# Patient Record
Sex: Male | Born: 1950 | Hispanic: No | Marital: Married | Smoking: Former smoker
Health system: Southern US, Community
[De-identification: ages and names within clinical notes are randomized; demographics above are authoritative.]

## PROBLEM LIST (undated history)

## (undated) DIAGNOSIS — E781 Pure hyperglyceridemia: Secondary | ICD-10-CM

## (undated) DIAGNOSIS — R972 Elevated prostate specific antigen [PSA]: Secondary | ICD-10-CM

## (undated) DIAGNOSIS — M47814 Spondylosis without myelopathy or radiculopathy, thoracic region: Secondary | ICD-10-CM

## (undated) DIAGNOSIS — G44009 Cluster headache syndrome, unspecified, not intractable: Secondary | ICD-10-CM

## (undated) DIAGNOSIS — M7532 Calcific tendinitis of left shoulder: Secondary | ICD-10-CM

## (undated) DIAGNOSIS — F419 Anxiety disorder, unspecified: Secondary | ICD-10-CM

## (undated) DIAGNOSIS — M720 Palmar fascial fibromatosis [Dupuytren]: Secondary | ICD-10-CM

## (undated) DIAGNOSIS — K4090 Unilateral inguinal hernia, without obstruction or gangrene, not specified as recurrent: Secondary | ICD-10-CM

## (undated) DIAGNOSIS — K635 Polyp of colon: Secondary | ICD-10-CM

## (undated) DIAGNOSIS — R7303 Prediabetes: Secondary | ICD-10-CM

## (undated) DIAGNOSIS — I10 Essential (primary) hypertension: Secondary | ICD-10-CM

## (undated) DIAGNOSIS — M5414 Radiculopathy, thoracic region: Secondary | ICD-10-CM

## (undated) DIAGNOSIS — L720 Epidermal cyst: Secondary | ICD-10-CM

## (undated) HISTORY — DX: Epidermal cyst: L72.0

## (undated) HISTORY — DX: Calcific tendinitis of left shoulder: M75.32

## (undated) HISTORY — DX: Radiculopathy, thoracic region: M54.14

## (undated) HISTORY — DX: Essential (primary) hypertension: I10

## (undated) HISTORY — DX: Palmar fascial fibromatosis (dupuytren): M72.0

## (undated) HISTORY — DX: Unilateral inguinal hernia, without obstruction or gangrene, not specified as recurrent: K40.90

## (undated) HISTORY — PX: COLONOSCOPY W/ POLYPECTOMY: SHX1380

## (undated) HISTORY — DX: Anxiety disorder, unspecified: F41.9

## (undated) HISTORY — DX: Pure hyperglyceridemia: E78.1

## (undated) HISTORY — PX: CATARACT EXTRACTION, BILATERAL: SHX1313

## (undated) HISTORY — DX: Spondylosis without myelopathy or radiculopathy, thoracic region: M47.814

## (undated) HISTORY — DX: Prediabetes: R73.03

## (undated) HISTORY — DX: Polyp of colon: K63.5

## (undated) HISTORY — DX: Elevated prostate specific antigen (PSA): R97.20

## (undated) HISTORY — DX: Cluster headache syndrome, unspecified, not intractable: G44.009

## (undated) HISTORY — PX: HERNIA REPAIR: SHX51

## (undated) HISTORY — PX: OTHER SURGICAL HISTORY: SHX169

---

## 1988-08-27 DIAGNOSIS — G44009 Cluster headache syndrome, unspecified, not intractable: Secondary | ICD-10-CM

## 1988-08-27 HISTORY — DX: Cluster headache syndrome, unspecified, not intractable: G44.009

## 2000-10-02 ENCOUNTER — Encounter: Payer: Self-pay | Admitting: Family Medicine

## 2000-10-02 ENCOUNTER — Encounter: Admission: RE | Admit: 2000-10-02 | Discharge: 2000-10-02 | Payer: Self-pay | Admitting: Family Medicine

## 2005-11-14 ENCOUNTER — Encounter: Payer: Self-pay | Admitting: Internal Medicine

## 2010-04-03 ENCOUNTER — Encounter: Payer: Self-pay | Admitting: Internal Medicine

## 2010-04-03 ENCOUNTER — Ambulatory Visit: Payer: Self-pay | Admitting: Internal Medicine

## 2010-04-03 DIAGNOSIS — E119 Type 2 diabetes mellitus without complications: Secondary | ICD-10-CM

## 2010-04-03 DIAGNOSIS — Z86010 Personal history of colonic polyps: Secondary | ICD-10-CM

## 2010-04-03 DIAGNOSIS — M47814 Spondylosis without myelopathy or radiculopathy, thoracic region: Secondary | ICD-10-CM | POA: Insufficient documentation

## 2010-04-03 DIAGNOSIS — M129 Arthropathy, unspecified: Secondary | ICD-10-CM | POA: Insufficient documentation

## 2010-04-03 DIAGNOSIS — R51 Headache: Secondary | ICD-10-CM

## 2010-04-03 DIAGNOSIS — R519 Headache, unspecified: Secondary | ICD-10-CM | POA: Insufficient documentation

## 2010-04-03 DIAGNOSIS — R030 Elevated blood-pressure reading, without diagnosis of hypertension: Secondary | ICD-10-CM

## 2010-04-03 LAB — CONVERTED CEMR LAB
Anti Nuclear Antibody(ANA): NEGATIVE
Rheumatoid fact SerPl-aCnc: <20 intl units/mL (ref 0–20)

## 2010-04-04 ENCOUNTER — Encounter: Payer: Self-pay | Admitting: Internal Medicine

## 2010-04-28 ENCOUNTER — Encounter: Payer: Self-pay | Admitting: Internal Medicine

## 2010-09-24 LAB — CONVERTED CEMR LAB
ALT: 19 units/L (ref 0–53)
AST: 23 units/L (ref 0–37)
Albumin: 4.3 g/dL (ref 3.5–5.2)
Alkaline Phosphatase: 63 units/L (ref 39–117)
BUN: 16 mg/dL (ref 6–23)
Basophils Absolute: 0.1 10*3/uL (ref 0.0–0.1)
Basophils Relative: 0.8 % (ref 0.0–3.0)
Bilirubin, Direct: 0.2 mg/dL (ref 0.0–0.3)
CO2: 31 meq/L (ref 19–32)
CRP, High Sensitivity: 2.28 (ref 0.00–5.00)
Calcium: 9.4 mg/dL (ref 8.4–10.5)
Chloride: 103 meq/L (ref 96–112)
Cholesterol: 212 mg/dL — ABNORMAL HIGH (ref 0–200)
Creatinine, Ser: 0.8 mg/dL (ref 0.4–1.5)
Direct LDL: 86.9 mg/dL
Eosinophils Absolute: 0.1 10*3/uL (ref 0.0–0.7)
Eosinophils Relative: 0.7 % (ref 0.0–5.0)
GFR calc non Af Amer: 106.8 mL/min (ref >60)
Glucose, Bld: 80 mg/dL (ref 70–99)
HCT: 43.6 % (ref 39.0–52.0)
HDL: 48.5 mg/dL (ref >39.00)
Hemoglobin: 15.4 g/dL (ref 13.0–17.0)
Hgb A1c MFr Bld: 5.8 % (ref 4.6–6.5)
Lymphocytes Relative: 21.6 % (ref 12.0–46.0)
Lymphs Abs: 1.7 10*3/uL (ref 0.7–4.0)
MCHC: 35.5 g/dL (ref 30.0–36.0)
MCV: 89.3 fL (ref 78.0–100.0)
Monocytes Absolute: 0.7 10*3/uL (ref 0.1–1.0)
Monocytes Relative: 8.8 % (ref 3.0–12.0)
Neutro Abs: 5.4 10*3/uL (ref 1.4–7.7)
Neutrophils Relative %: 68.1 % (ref 43.0–77.0)
PSA: 2.53 ng/mL (ref 0.10–4.00)
Platelets: 316 10*3/uL (ref 150.0–400.0)
Potassium: 4.5 meq/L (ref 3.5–5.1)
RBC: 4.88 M/uL (ref 4.22–5.81)
RDW: 13.9 % (ref 11.5–14.6)
Sed Rate: 11 mm/hr (ref 0–22)
Sodium: 142 meq/L (ref 135–145)
TSH: 0.98 microintl units/mL (ref 0.35–5.50)
Total Bilirubin: 0.8 mg/dL (ref 0.3–1.2)
Total CHOL/HDL Ratio: 4
Total CK: 192 units/L (ref 7–232)
Total Protein: 7.5 g/dL (ref 6.0–8.3)
Triglycerides: 205 mg/dL — ABNORMAL HIGH (ref 0.0–149.0)
VLDL: 41 mg/dL — ABNORMAL HIGH (ref 0.0–40.0)
WBC: 7.9 10*3/uL (ref 4.5–10.5)

## 2010-09-26 NOTE — Letter (Signed)
Summary: Lipid Letter  [facility]  [address] [place], [place] [address]   Phone: [phone]  Fax: [phone]    04/04/2010  Nimai Burbach [address] [place], [place]  [address]  Dear Harvie Heck:  We have carefully reviewed your last lipid profile from  and the results are noted below with a summary of recommendations for lipid management.    Cholesterol:       212     Goal: <200   HDL "good" Cholesterol:   57.84     Goal: >40 very good   LDL "bad" Cholesterol:   87     Goal: <130 excellent   Triglycerides:       205.0     Goal: <150 not so good        TLC Diet (Therapeutic Lifestyle Change): Saturated Fats & Transfatty acids should be kept < 7% of total calories ***Reduce Saturated Fats Polyunstaurated Fat can be up to 10% of total calories Monounsaturated Fat Fat can be up to 20% of total calories Total Fat should be no greater than 25-35% of total calories Carbohydrates should be 50-60% of total calories Protein should be approximately 15% of total calories Fiber should be at least 20-30 grams a day ***Increased fiber may help lower LDL Total Cholesterol should be < 200mg /day Consider adding plant stanol/sterols to diet (example: Benacol spread) ***A higher intake of unsaturated fat may reduce Triglycerides and Increase HDL    Adjunctive Measures (may lower LIPIDS and reduce risk of Heart Attack) include: Aerobic Exercise (20-30 minutes 3-4 times a week) Limit Alcohol Consumption Weight Reduction Aspirin 75-81 mg a day by mouth (if not allergic or contraindicated) Dietary Fiber 20-30 grams a day by mouth     Current Medications:  None If you have any questions, please call. We appreciate being able to work with you.   Sincerely,    [facility] Etta Grandchild MD

## 2010-09-26 NOTE — Assessment & Plan Note (Signed)
Summary: NEW PT PHY-[employer]-#--PKG--STC   Vital Signs:  Patient profile:   60 year old male Height:      69 inches Weight:      186 pounds BMI:     27.57 O2 Sat:      98 % on Room air Temp:     98.1 degrees F oral Pulse rate:   60 / minute Pulse rhythm:   regular Resp:     16 per minute BP sitting:   144 / 88  (left arm) Cuff size:   large  Vitals Entered By: Rock Nephew CMA (April 03, 2010 10:58 AM)  Nutrition Counseling: Patient's BMI is greater than 25 and therefore counseled on weight management options.  O2 Flow:  Room air CC: New pt CPX w/ labs, Preventive Care Is Patient Diabetic? No Pain Assessment Patient in pain? no        Primary Care Provider:  Etta Grandchild MD  CC:  New pt CPX w/ labs and Preventive Care.  History of Present Illness: New to me for a complete physical- he has a long history of small and large joint aches and Dupuytren's contractures. He says that he has never had a rheumatological work-up.  Preventive Screening-Counseling & Management  Alcohol-Tobacco     Alcohol drinks/day: 0     Smoking Status: quit     Smoke Cessation Stage: quit     Year Started: 1972     Year Quit: 2000     Pack years: 35     Tobacco Counseling: to remain off tobacco products  Caffeine-Diet-Exercise     Caffeine use/day: Yes     Does Patient Exercise: no  Hep-HIV-STD-Contraception     Hepatitis Risk: no risk noted     HIV Risk: no risk noted     STD Risk: no risk noted     TSE monthly: yes     Testicular SE Education/Counseling to perform regular STE  Safety-Violence-Falls     Seat Belt Use: yes     Helmet Use: yes     Firearms in the Home: firearms in the home     Firearm Counseling: to practice firearm safety     Smoke Detectors: yes     Violence in the Home: no risk noted     Sexual Abuse: no      Sexual History:  currently monogamous.        Drug Use:  never and no.        Blood Transfusions:  no.    Medications Prior to Update: 1)   None  Current Medications (verified): 1)  None  Allergies (verified): 1)  ! Penicillin  Past History:  Past Medical History: Colonic polyps, hx of Diabetes mellitus, type II Headache Osteoarthritis Prostate nodule  Past Surgical History: Denies surgical history  Family History: Family History Hypertension Family History Heart Disease Family History Stroke  Social History: Occupation: Designer, industrial/product Married Alcohol use-no Drug use-no Regular exercise-no Drug Use:  never, no Does Patient Exercise:  no Education:  9-12 yrs Transportation:  Own Print production planner Use:  yes Alcohol:  None Caffeine use/day:  Yes Smoking Status:  quit Sex Orientation:  Heterosexual Hepatitis Risk:  no risk noted HIV Risk:  no risk noted STD Risk:  no risk noted Sexual History:  currently monogamous Blood Transfusions:  no  Review of Systems  The patient denies anorexia, fever, weight loss, weight gain, vision loss, chest pain, syncope, dyspnea on exertion, peripheral edema, prolonged  cough, headaches, hemoptysis, abdominal pain, melena, hematochezia, severe indigestion/heartburn, hematuria, suspicious skin lesions, transient blindness, difficulty walking, depression, abnormal bleeding, enlarged lymph nodes, angioedema, and testicular masses.   GU:  Denies decreased libido, discharge, dysuria, erectile dysfunction, hematuria, incontinence, nocturia, urinary frequency, and urinary hesitancy. MS:  Complains of joint pain; denies joint redness, joint swelling, loss of strength, low back pain, mid back pain, muscle aches, muscle weakness, stiffness, and thoracic pain. Endo:  Denies cold intolerance, excessive hunger, excessive thirst, excessive urination, heat intolerance, polyuria, and weight change.  Physical Exam  General:  alert, well-developed, well-nourished, well-hydrated, appropriate dress, normal appearance, healthy-appearing, cooperative to examination, and good hygiene.   Head:   normocephalic, atraumatic, no abnormalities observed, and no abnormalities palpated.   Eyes:  vision grossly intact, pupils equal, pupils round, and pupils reactive to light.   Ears:  R ear normal and L ear normal.   Nose:  External nasal examination shows no deformity or inflammation. Nasal mucosa are pink and moist without lesions or exudates. Mouth:  Oral mucosa and oropharynx without lesions or exudates.  Teeth in good repair. Neck:  supple, full ROM, no masses, no thyromegaly, no thyroid nodules or tenderness, no JVD, normal carotid upstroke, no carotid bruits, no cervical lymphadenopathy, and no neck tenderness.   Chest Wall:  no deformities, no tenderness, and no masses.   Breasts:  no gynecomastia, no masses, and no adenopathy.   Lungs:  normal respiratory effort, no intercostal retractions, no accessory muscle use, normal breath sounds, no dullness, no fremitus, no crackles, and no wheezes.   Heart:  normal rate, regular rhythm, no murmur, no gallop, no rub, and no JVD.   Abdomen:  soft, non-tender, normal bowel sounds, no distention, no masses, no guarding, no rigidity, no rebound tenderness, no abdominal hernia, no inguinal hernia, no hepatomegaly, and no splenomegaly.   Rectal:  No external abnormalities noted. Normal sphincter tone. No rectal masses or tenderness. Genitalia:  circumcised, no hydrocele, no varicocele, no scrotal masses, no testicular masses or atrophy, no cutaneous lesions, and no urethral discharge.   Prostate:  no nodules, no asymmetry, no induration, and 1+ enlarged.   Msk:  normal ROM, no joint tenderness, no joint swelling, no joint warmth, no redness over joints, no joint deformities, no joint instability, and no crepitation.  mild dupuytren's in both hands. Pulses:  R and L carotid,radial,femoral,dorsalis pedis and posterior tibial pulses are full and equal bilaterally Extremities:  No clubbing, cyanosis, edema, or deformity noted with normal full range of motion  of all joints.   Neurologic:  No cranial nerve deficits noted. Station and gait are normal. Plantar reflexes are down-going bilaterally. DTRs are symmetrical throughout. Sensory, motor and coordinative functions appear intact. Skin:  turgor normal, color normal, no rashes, no suspicious lesions, no ecchymoses, no petechiae, no purpura, no ulcerations, and no edema.   Cervical Nodes:  no anterior cervical adenopathy and no posterior cervical adenopathy.   Axillary Nodes:  no R axillary adenopathy and no L axillary adenopathy.   Inguinal Nodes:  no R inguinal adenopathy and no L inguinal adenopathy.   Psych:  Cognition and judgment appear intact. Alert and cooperative with normal attention span and concentration. No apparent delusions, illusions, hallucinations   Impression & Recommendations:  Problem # 1:  ELEVATED BLOOD PRESSURE WITHOUT DIAGNOSIS OF HYPERTENSION (ICD-796.2) Assessment New  Orders: Venipuncture (09811) TLB-Lipid Panel (80061-LIPID) TLB-BMP (Basic Metabolic Panel-BMET) (80048-METABOL) TLB-CBC Platelet - w/Differential (85025-CBCD) TLB-Hepatic/Liver Function Pnl (80076-HEPATIC) TLB-TSH (Thyroid Stimulating Hormone) (84443-TSH) TLB-Rheumatoid  Factor (RA) ([address]-UE) TLB-Sedimentation Rate (ESR) (85652-ESR) TLB-CK Total Only(Creatine Kinase/CPK) (82550-CK) TLB-CRP-High Sensitivity (C-Reactive Protein) (86140-FCRP) TLB-PSA (Prostate Specific Antigen) (84153-PSA) T-Antinuclear Antib (ANA) (45409-81191) TLB-A1C / Hgb A1C (Glycohemoglobin) (83036-A1C) EKG w/ Interpretation (93000)  BP today: 144/88  Instructed in low sodium diet (DASH Handout) and behavior modification.    Problem # 2:  ARTHRITIS, GENERALIZED (ICD-716.99) Assessment: New  Orders: Venipuncture (47829) TLB-Lipid Panel (80061-LIPID) TLB-BMP (Basic Metabolic Panel-BMET) (80048-METABOL) TLB-CBC Platelet - w/Differential (85025-CBCD) TLB-Hepatic/Liver Function Pnl (80076-HEPATIC) TLB-TSH (Thyroid  Stimulating Hormone) (84443-TSH) TLB-Rheumatoid Factor (RA) (56213-YQ) TLB-Sedimentation Rate (ESR) (85652-ESR) TLB-CK Total Only(Creatine Kinase/CPK) (82550-CK) TLB-CRP-High Sensitivity (C-Reactive Protein) (86140-FCRP) TLB-PSA (Prostate Specific Antigen) (84153-PSA) T-Antinuclear Antib (ANA) (65784-69629) TLB-A1C / Hgb A1C (Glycohemoglobin) (83036-A1C)  Problem # 3:  ROUTINE GENERAL MEDICAL EXAM@HEALTH  CARE FACL (ICD-V70.0) Assessment: New  Orders: Venipuncture (52841) TLB-Lipid Panel (80061-LIPID) TLB-BMP (Basic Metabolic Panel-BMET) (80048-METABOL) TLB-CBC Platelet - w/Differential (85025-CBCD) TLB-Hepatic/Liver Function Pnl (80076-HEPATIC) TLB-TSH (Thyroid Stimulating Hormone) (84443-TSH) TLB-Rheumatoid Factor (RA) (32440-NU) TLB-Sedimentation Rate (ESR) (85652-ESR) TLB-CK Total Only(Creatine Kinase/CPK) (82550-CK) TLB-CRP-High Sensitivity (C-Reactive Protein) (86140-FCRP) TLB-PSA (Prostate Specific Antigen) (84153-PSA) T-Antinuclear Antib (ANA) (27253-66440) TLB-A1C / Hgb A1C (Glycohemoglobin) (83036-A1C) Hemoccult Guaiac-1 spec.(in office) (82270) EKG w/ Interpretation (93000)  Discussed using sunscreen, use of alcohol, drug use, self testicular exam, routine dental care, routine eye care, routine physical exam, seat belts, multiple vitamins, and recommendations for immunizations.  Discussed exercise and checking cholesterol.  Discussed gun safety. Also recommend checking PSA.  Problem # 4:  DIABETES MELLITUS, TYPE II (ICD-250.00) Assessment: Unchanged  Orders: Venipuncture (34742) TLB-Lipid Panel (80061-LIPID) TLB-BMP (Basic Metabolic Panel-BMET) (80048-METABOL) TLB-CBC Platelet - w/Differential (85025-CBCD) TLB-Hepatic/Liver Function Pnl (80076-HEPATIC) TLB-TSH (Thyroid Stimulating Hormone) (84443-TSH) TLB-Rheumatoid Factor (RA) (59563-OV) TLB-Sedimentation Rate (ESR) (85652-ESR) TLB-CK Total Only(Creatine Kinase/CPK) (82550-CK) TLB-CRP-High Sensitivity  (C-Reactive Protein) (86140-FCRP) TLB-PSA (Prostate Specific Antigen) (84153-PSA) T-Antinuclear Antib (ANA) (56433-29518) TLB-A1C / Hgb A1C (Glycohemoglobin) (83036-A1C)  Problem # 5:  COLONIC POLYPS, HX OF (ICD-V12.72) Assessment: Unchanged  Orders: Gastroenterology Referral (GI) Hemoccult Guaiac-1 spec.(in office) (84166)  Colorectal Screening:  Current Recommendations:    Hemoccult: NEG X 1 today    Colonoscopy recommended: scheduled with G.I.  Colonoscopy Results:    Date of Exam: 04/11/2004    Results: Adenomatous Polyp  PSA Screening:    Reviewed PSA screening recommendations: PSA ordered  Patient Instructions: 1)  It is important that you exercise regularly at least 20 minutes 5 times a week. If you develop chest pain, have severe difficulty breathing, or feel very tired , stop exercising immediately and seek medical attention. 2)  You need to lose weight. Consider a lower calorie diet and regular exercise.  3)  It is not healthy  for men to drink more than 2-3 drinks per day or for women to drink more than 1-2 drinks per day. 4)  Schedule a colonoscopy/sigmoidoscopy to help detect colon cancer. 5)  Take an Aspirin every day. 6)  Check your Blood Pressure regularly. If it is above 140/90: you should make an appointment. 7)  Please schedule a follow-up appointment as needed.

## 2010-09-26 NOTE — Letter (Signed)
Summary: [facility]  [facility]   Imported By: Lester Rio Bravo 05/08/2010 09:50:21  _____________________________________________________________________  External Attachment:    Type:   Image     Comment:   External Document

## 2012-04-02 ENCOUNTER — Other Ambulatory Visit (INDEPENDENT_AMBULATORY_CARE_PROVIDER_SITE_OTHER): Payer: Managed Care, Other (non HMO)

## 2012-04-02 ENCOUNTER — Encounter: Payer: Self-pay | Admitting: Internal Medicine

## 2012-04-02 ENCOUNTER — Ambulatory Visit (INDEPENDENT_AMBULATORY_CARE_PROVIDER_SITE_OTHER): Payer: Managed Care, Other (non HMO) | Admitting: Internal Medicine

## 2012-04-02 VITALS — BP 136/80 | HR 58 | Temp 98.1°F | Resp 14 | Wt 181.8 lb

## 2012-04-02 DIAGNOSIS — Z136 Encounter for screening for cardiovascular disorders: Secondary | ICD-10-CM | POA: Insufficient documentation

## 2012-04-02 DIAGNOSIS — Z0000 Encounter for general adult medical examination without abnormal findings: Secondary | ICD-10-CM

## 2012-04-02 DIAGNOSIS — Z86010 Personal history of colonic polyps: Secondary | ICD-10-CM

## 2012-04-02 LAB — CBC WITH DIFFERENTIAL/PLATELET
Eosinophils Relative: 1.6 % (ref 0.0–5.0)
HCT: 44.4 % (ref 39.0–52.0)
Hemoglobin: 15.3 g/dL (ref 13.0–17.0)
Lymphs Abs: 1.7 10*3/uL (ref 0.7–4.0)
Monocytes Relative: 9.4 % (ref 3.0–12.0)
Neutro Abs: 4.8 10*3/uL (ref 1.4–7.7)
WBC: 7.3 10*3/uL (ref 4.5–10.5)

## 2012-04-02 LAB — COMPREHENSIVE METABOLIC PANEL
CO2: 28 mEq/L (ref 19–32)
Creatinine, Ser: 0.8 mg/dL (ref 0.4–1.5)
GFR: 110.93 mL/min (ref >60.00)
Glucose, Bld: 106 mg/dL — ABNORMAL HIGH (ref 70–99)
Total Bilirubin: 0.7 mg/dL (ref 0.3–1.2)

## 2012-04-02 LAB — URINALYSIS, ROUTINE W REFLEX MICROSCOPIC
Bilirubin Urine: NEGATIVE
Hgb urine dipstick: NEGATIVE
Nitrite: NEGATIVE
Total Protein, Urine: NEGATIVE
Urobilinogen, UA: 0.2 (ref 0.0–1.0)

## 2012-04-02 LAB — LIPID PANEL
Cholesterol: 217 mg/dL — ABNORMAL HIGH (ref 0–200)
Total CHOL/HDL Ratio: 4
Triglycerides: 187 mg/dL — ABNORMAL HIGH (ref 0.0–149.0)

## 2012-04-02 LAB — TSH: TSH: 1.11 u[IU]/mL (ref 0.35–5.50)

## 2012-04-02 NOTE — Assessment & Plan Note (Signed)
I think he is due for a repeat colonoscopy

## 2012-04-02 NOTE — Assessment & Plan Note (Signed)
The EKG is normal 

## 2012-04-02 NOTE — Assessment & Plan Note (Signed)
Exam done, labs ordered, vaccines were reviewed, pt ed material was given 

## 2012-04-02 NOTE — Progress Notes (Signed)
  Subjective:    Patient ID: Jacob Garrett, male    DOB: Apr 23, 1951, 61 y.o.   MRN: [id number]  HPI  He returns for a physical. He feels well and offers no complaints.  Review of Systems  Constitutional: Negative.   HENT: Negative.   Eyes: Negative.   Respiratory: Negative.   Cardiovascular: Negative.   Gastrointestinal: Negative.   Genitourinary: Negative.   Musculoskeletal: Negative.   Skin: Negative.   Neurological: Negative.   Hematological: Negative.   Psychiatric/Behavioral: Negative.        Objective:   Physical Exam  Vitals reviewed. Constitutional: He is oriented to person, place, and time. He appears well-developed and well-nourished. No distress.  HENT:  Head: Normocephalic and atraumatic.  Mouth/Throat: Oropharynx is clear and moist. No oropharyngeal exudate.  Eyes: Conjunctivae are normal. Right eye exhibits no discharge. Left eye exhibits no discharge. No scleral icterus.  Neck: Normal range of motion. Neck supple. No JVD present. No tracheal deviation present. No thyromegaly present.  Cardiovascular: Normal rate, regular rhythm, normal heart sounds and intact distal pulses.  Exam reveals no gallop and no friction rub.   No murmur heard. Pulmonary/Chest: Effort normal and breath sounds normal. No stridor. No respiratory distress. He has no wheezes. He has no rales. He exhibits no tenderness.  Abdominal: Soft. Bowel sounds are normal. He exhibits no distension and no mass. There is no tenderness. There is no rebound and no guarding. Hernia confirmed negative in the right inguinal area and confirmed negative in the left inguinal area.  Genitourinary: Rectum normal, testes normal and penis normal. Rectal exam shows no external hemorrhoid, no internal hemorrhoid, no fissure, no mass, no tenderness and anal tone normal. Guaiac negative stool. Prostate is not enlarged and not tender. Right testis shows no mass, no swelling and no tenderness. Right testis is descended.  Left testis shows no mass, no swelling and no tenderness. Left testis is descended. Circumcised. No penile tenderness. No discharge found.  Musculoskeletal: Normal range of motion. He exhibits no edema and no tenderness.  Lymphadenopathy:    He has no cervical adenopathy.       Right: No inguinal adenopathy present.       Left: No inguinal adenopathy present.  Neurological: He is oriented to person, place, and time.  Skin: Skin is warm and dry. No rash noted. He is not diaphoretic. No erythema. No pallor.  Psychiatric: He has a normal mood and affect. His behavior is normal. Judgment and thought content normal.      Lab Results  Component Value Date   WBC 7.9 04/03/2010   HGB 15.4 04/03/2010   HCT 43.6 04/03/2010   PLT 316.0 04/03/2010   GLUCOSE 80 04/03/2010   CHOL 212* 04/03/2010   TRIG 205.0* 04/03/2010   HDL 48.50 04/03/2010   LDLDIRECT 86.9 04/03/2010   ALT 19 04/03/2010   AST 23 04/03/2010   NA 142 04/03/2010   K 4.5 04/03/2010   CL 103 04/03/2010   CREATININE 0.8 04/03/2010   BUN 16 04/03/2010   CO2 31 04/03/2010   TSH 0.98 04/03/2010   PSA 2.53 04/03/2010   HGBA1C 5.8 04/03/2010      Assessment & Plan:

## 2012-04-02 NOTE — Patient Instructions (Signed)
Health Maintenance, Males A healthy lifestyle and preventative care can promote health and wellness.  Maintain regular health, dental, and eye exams.   Eat a healthy diet. Foods like vegetables, fruits, whole grains, low-fat dairy products, and lean protein foods contain the nutrients you need without too many calories. Decrease your intake of foods high in solid fats, added sugars, and salt. Get information about a proper diet from your caregiver, if necessary.   Regular physical exercise is one of the most important things you can do for your health. Most adults should get at least 150 minutes of moderate-intensity exercise (any activity that increases your heart rate and causes you to sweat) each week. In addition, most adults need muscle-strengthening exercises on 2 or more days a week.    Maintain a healthy weight. The body mass index (BMI) is a screening tool to identify possible weight problems. It provides an estimate of body fat based on height and weight. Your caregiver can help determine your BMI, and can help you achieve or maintain a healthy weight. For adults 20 years and older:   A BMI below 18.5 is considered underweight.   A BMI of 18.5 to 24.9 is normal.   A BMI of 25 to 29.9 is considered overweight.   A BMI of 30 and above is considered obese.   Maintain normal blood lipids and cholesterol by exercising and minimizing your intake of saturated fat. Eat a balanced diet with plenty of fruits and vegetables. Blood tests for lipids and cholesterol should begin at age 20 and be repeated every 5 years. If your lipid or cholesterol levels are high, you are over 50, or you are a high risk for heart disease, you may need your cholesterol levels checked more frequently.Ongoing high lipid and cholesterol levels should be treated with medicines, if diet and exercise are not effective.   If you smoke, find out from your caregiver how to quit. If you do not use tobacco, do not start.    If you choose to drink alcohol, do not exceed 2 drinks per day. One drink is considered to be 12 ounces (355 mL) of beer, 5 ounces (148 mL) of wine, or 1.5 ounces (44 mL) of liquor.   Avoid use of street drugs. Do not share needles with anyone. Ask for help if you need support or instructions about stopping the use of drugs.   High blood pressure causes heart disease and increases the risk of stroke. Blood pressure should be checked at least every 1 to 2 years. Ongoing high blood pressure should be treated with medicines if weight loss and exercise are not effective.   If you are 45 to 61 years old, ask your caregiver if you should take aspirin to prevent heart disease.   Diabetes screening involves taking a blood sample to check your fasting blood sugar level. This should be done once every 3 years, after age 45, if you are within normal weight and without risk factors for diabetes. Testing should be considered at a younger age or be carried out more frequently if you are overweight and have at least 1 risk factor for diabetes.   Colorectal cancer can be detected and often prevented. Most routine colorectal cancer screening begins at the age of 50 and continues through age 75. However, your caregiver may recommend screening at an earlier age if you have risk factors for colon cancer. On a yearly basis, your caregiver may provide home test kits to check for hidden   blood in the stool. Use of a small camera at the end of a tube, to directly examine the colon (sigmoidoscopy or colonoscopy), can detect the earliest forms of colorectal cancer. Talk to your caregiver about this at age 50, when routine screening begins. Direct examination of the colon should be repeated every 5 to 10 years through age 75, unless early forms of pre-cancerous polyps or small growths are found.   Hepatitis C blood testing is recommended for all people born from 1945 through 1965 and any individual with known risks for  hepatitis C.   Healthy men should no longer receive prostate-specific antigen (PSA) blood tests as part of routine cancer screening. Consult with your caregiver about prostate cancer screening.   Testicular cancer screening is not recommended for adolescents or adult males who have no symptoms. Screening includes self-exam, caregiver exam, and other screening tests. Consult with your caregiver about any symptoms you have or any concerns you have about testicular cancer.   Practice safe sex. Use condoms and avoid high-risk sexual practices to reduce the spread of sexually transmitted infections (STIs).   Use sunscreen with a sun protection factor (SPF) of 30 or greater. Apply sunscreen liberally and repeatedly throughout the day. You should seek shade when your shadow is shorter than you. Protect yourself by wearing long sleeves, pants, a wide-brimmed hat, and sunglasses year round, whenever you are outdoors.   Notify your caregiver of new moles or changes in moles, especially if there is a change in shape or color. Also notify your caregiver if a mole is larger than the size of a pencil eraser.   A one-time screening for abdominal aortic aneurysm (AAA) and surgical repair of large AAAs by sound wave imaging (ultrasonography) is recommended for ages 65 to 75 years who are current or former smokers.   Stay current with your immunizations.  Document Released: 02/09/2008 Document Revised: 08/02/2011 Document Reviewed: 01/08/2011 [employer] Patient Information 2012 [employer]. 

## 2012-12-25 ENCOUNTER — Ambulatory Visit (INDEPENDENT_AMBULATORY_CARE_PROVIDER_SITE_OTHER): Payer: BC Managed Care – PPO | Admitting: Internal Medicine

## 2012-12-25 ENCOUNTER — Encounter: Payer: Self-pay | Admitting: Internal Medicine

## 2012-12-25 ENCOUNTER — Other Ambulatory Visit (INDEPENDENT_AMBULATORY_CARE_PROVIDER_SITE_OTHER): Payer: BC Managed Care – PPO

## 2012-12-25 ENCOUNTER — Ambulatory Visit (INDEPENDENT_AMBULATORY_CARE_PROVIDER_SITE_OTHER)
Admission: RE | Admit: 2012-12-25 | Discharge: 2012-12-25 | Disposition: A | Discharge status: Home / Self Care | Payer: BC Managed Care – PPO | Source: Ambulatory Visit | Attending: Internal Medicine | Admitting: Internal Medicine

## 2012-12-25 VITALS — BP 140/86 | HR 58 | Temp 98.5°F | Resp 16 | Wt 167.0 lb

## 2012-12-25 DIAGNOSIS — R109 Unspecified abdominal pain: Secondary | ICD-10-CM

## 2012-12-25 DIAGNOSIS — N400 Enlarged prostate without lower urinary tract symptoms: Secondary | ICD-10-CM

## 2012-12-25 DIAGNOSIS — R05 Cough: Secondary | ICD-10-CM

## 2012-12-25 LAB — CBC WITH DIFFERENTIAL/PLATELET
Eosinophils Relative: 0.5 % (ref 0.0–5.0)
HCT: 47 % (ref 39.0–52.0)
Monocytes Relative: 10.3 % (ref 3.0–12.0)
Neutrophils Relative %: 68.4 % (ref 43.0–77.0)
Platelets: 347 10*3/uL (ref 150.0–400.0)
RBC: 5.28 Mil/uL (ref 4.22–5.81)
WBC: 7.8 10*3/uL (ref 4.5–10.5)

## 2012-12-25 LAB — COMPREHENSIVE METABOLIC PANEL
Albumin: 4.4 g/dL (ref 3.5–5.2)
BUN: 19 mg/dL (ref 6–23)
CO2: 31 mEq/L (ref 19–32)
GFR: 97.25 mL/min (ref >60.00)
Glucose, Bld: 93 mg/dL (ref 70–99)
Sodium: 136 mEq/L (ref 135–145)
Total Bilirubin: 1.2 mg/dL (ref 0.3–1.2)
Total Protein: 8 g/dL (ref 6.0–8.3)

## 2012-12-25 LAB — URINALYSIS, ROUTINE W REFLEX MICROSCOPIC
Bilirubin Urine: NEGATIVE
Hgb urine dipstick: NEGATIVE
Ketones, ur: NEGATIVE
Leukocytes, UA: NEGATIVE
pH: 7 (ref 5.0–8.0)

## 2012-12-25 LAB — PSA: PSA: 3.73 ng/mL (ref 0.10–4.00)

## 2012-12-25 LAB — FECAL OCCULT BLOOD, GUAIAC: Fecal Occult Blood: NEGATIVE

## 2012-12-25 NOTE — Progress Notes (Signed)
Subjective:    Patient ID: Jacob Garrett, male    DOB: 02-Aug-1951, 62 y.o.   MRN: [id number]  Flank Pain This is a recurrent problem. Episode onset: 2-3 months. The problem is unchanged. Pain location: right posterior flank. The quality of the pain is described as aching. The pain does not radiate. The pain is at a severity of 2/10. The pain is mild. The pain is worse during the day. The symptoms are aggravated by position. Associated symptoms include abdominal pain and weight loss. Pertinent negatives include no bladder incontinence, bowel incontinence, chest pain, dysuria, fever, headaches, leg pain, numbness, paresis, paresthesias, pelvic pain, perianal numbness, tingling or weakness.  Cough This is a recurrent problem. Episode onset: 3-4 months ago. The problem has been unchanged. The problem occurs every few hours. The cough is productive of sputum. Associated symptoms include weight loss. Pertinent negatives include no chest pain, chills, ear congestion, ear pain, fever, headaches, heartburn, hemoptysis, myalgias, nasal congestion, postnasal drip, rash, rhinorrhea, sore throat, shortness of breath, sweats or wheezing. He has tried nothing for the symptoms.      Review of Systems  Constitutional: Positive for weight loss. Negative for fever, chills, diaphoresis, activity change, appetite change, fatigue and unexpected weight change.  HENT: Negative.  Negative for ear pain, sore throat, facial swelling, rhinorrhea, sneezing, trouble swallowing, neck pain, neck stiffness, voice change, postnasal drip and sinus pressure.   Eyes: Negative.   Respiratory: Positive for cough. Negative for apnea, hemoptysis, choking, chest tightness, shortness of breath, wheezing and stridor.   Cardiovascular: Negative.  Negative for chest pain, palpitations and leg swelling.  Gastrointestinal: Positive for abdominal pain. Negative for heartburn, nausea, vomiting, diarrhea, constipation, blood in stool, abdominal  distention, anal bleeding, rectal pain and bowel incontinence.  Endocrine: Negative.   Genitourinary: Positive for flank pain and difficulty urinating (some weak stream). Negative for bladder incontinence, dysuria, frequency, hematuria, decreased urine volume, discharge, penile swelling, enuresis, genital sores, penile pain, testicular pain and pelvic pain.  Musculoskeletal: Negative for myalgias, back pain, joint swelling, arthralgias and gait problem.  Skin: Negative.  Negative for color change, rash and wound.  Allergic/Immunologic: Negative.   Neurological: Negative.  Negative for dizziness, tingling, weakness, numbness, headaches and paresthesias.  Hematological: Negative.  Negative for adenopathy. Does not bruise/bleed easily.  Psychiatric/Behavioral: Negative.        Objective:   Physical Exam  Vitals reviewed. Constitutional: He is oriented to person, place, and time. Vital signs are normal. He appears well-developed and well-nourished.  Non-toxic appearance. He does not have a sickly appearance. He does not appear ill. No distress.  HENT:  Head: Normocephalic and atraumatic.  Mouth/Throat: Oropharynx is clear and moist. No oropharyngeal exudate.  Eyes: Conjunctivae are normal. Right eye exhibits no discharge. Left eye exhibits no discharge. No scleral icterus.  Neck: Normal range of motion. Neck supple. No JVD present. No tracheal deviation present. No thyromegaly present.  Cardiovascular: Normal rate, regular rhythm, normal heart sounds and intact distal pulses.  Exam reveals no gallop and no friction rub.   No murmur heard. Pulmonary/Chest: Effort normal and breath sounds normal. No stridor. No respiratory distress. He has no wheezes. He has no rales. He exhibits no tenderness.  Abdominal: Soft. Normal appearance, normal aorta and bowel sounds are normal. He exhibits no shifting dullness, no distension, no pulsatile liver, no fluid wave, no abdominal bruit, no ascites, no pulsatile  midline mass and no mass. There is no hepatosplenomegaly, splenomegaly or hepatomegaly. There is tenderness (right lateral  flank area). There is no rigidity, no rebound, no guarding, no CVA tenderness, no tenderness at McBurney's point and negative Murphy's sign. No hernia. Hernia confirmed negative in the ventral area, confirmed negative in the right inguinal area and confirmed negative in the left inguinal area.    Genitourinary: Rectum normal, testes normal and penis normal. Rectal exam shows no external hemorrhoid, no internal hemorrhoid, no fissure, no mass, no tenderness and anal tone normal. Prostate is enlarged (1+ smooth symm BPH). Prostate is not tender. Right testis shows no mass, no swelling and no tenderness. Right testis is descended. Left testis shows no mass, no swelling and no tenderness. Left testis is descended. Circumcised. No penile erythema or penile tenderness. No discharge found.  Musculoskeletal: Normal range of motion. He exhibits no edema and no tenderness.  Lymphadenopathy:    He has no cervical adenopathy.       Right: No inguinal adenopathy present.       Left: No inguinal adenopathy present.  Neurological: He is oriented to person, place, and time.  Skin: Skin is warm and dry. No rash noted. He is not diaphoretic. No erythema. No pallor.  Psychiatric: He has a normal mood and affect. His behavior is normal. Judgment and thought content normal.     Lab Results  Component Value Date   WBC 7.3 04/02/2012   HGB 15.3 04/02/2012   HCT 44.4 04/02/2012   PLT 285.0 04/02/2012   GLUCOSE 106* 04/02/2012   CHOL 217* 04/02/2012   TRIG 187.0* 04/02/2012   HDL 57.70 04/02/2012   LDLDIRECT 81.1 04/02/2012   ALT 16 04/02/2012   AST 23 04/02/2012   NA 138 04/02/2012   K 4.3 04/02/2012   CL 102 04/02/2012   CREATININE 0.8 04/02/2012   BUN 10 04/02/2012   CO2 28 04/02/2012   TSH 1.11 04/02/2012   PSA 2.32 04/02/2012   HGBA1C 5.8 04/03/2010       Assessment & Plan:

## 2012-12-25 NOTE — Patient Instructions (Signed)
Flank Pain  Flank pain refers to pain that is located on the side of the body between the upper abdomen and the back. It can be caused by many things.  CAUSES   Some of the more common causes of flank pain include:   Muscle strain.   Muscle spasms.   A disease of your spine (vertebral disk disease).   A lung infection (pneumonia).   Fluid around your lungs (pulmonary edema).   A kidney infection.   Kidney stones.   A very painful skin rash on only one side of your body (shingles).   Gallbladder disease.  DIAGNOSIS   Blood tests, urine tests, and X-rays may help your caregiver determine what is wrong.  TREATMENT   The treatment of pain depends on the cause. Your caregiver will determine what treatment will work best for you.  HOME CARE INSTRUCTIONS    Home care will depend on the cause of your pain.   Some medications may help relieve the pain. Take medication for relief of pain as directed by your caregiver.   Tell your caregiver about any changes in your pain.   Follow up with your caregiver.  SEEK IMMEDIATE MEDICAL CARE IF:    Your pain is not controlled with medication.   The pain increases.   You have abdominal pain.   You have shortness of breath.   You have persistent nausea or vomiting.   You have swelling in your abdomen.   You feel faint or pass out.   You have a temperature by mouth above 102 F (38.9 C), not controlled by medicine.  MAKE SURE YOU:    Understand these instructions.   Will watch your condition.   Will get help right away if you are not doing well or get worse.  Document Released: 10/04/2005 Document Revised: 11/05/2011 Document Reviewed: 01/28/2010  [employer] Patient Information 2013 [employer].

## 2012-12-25 NOTE — Assessment & Plan Note (Signed)
He tells me that his symptoms are too mild to require medical therapy

## 2012-12-25 NOTE — Assessment & Plan Note (Signed)
Labs, xray, UA are all normal I favor a MSK cause He will treat with nsaids

## 2012-12-25 NOTE — Assessment & Plan Note (Signed)
CXR is normal I think this is a post-viral cough He does not want to take a cough medicine

## 2013-05-20 ENCOUNTER — Ambulatory Visit (INDEPENDENT_AMBULATORY_CARE_PROVIDER_SITE_OTHER): Payer: BC Managed Care – PPO | Admitting: Internal Medicine

## 2013-05-20 ENCOUNTER — Encounter: Payer: Self-pay | Admitting: Internal Medicine

## 2013-05-20 VITALS — BP 132/86 | HR 66 | Temp 98.1°F | Resp 16 | Wt 173.0 lb

## 2013-05-20 DIAGNOSIS — K4090 Unilateral inguinal hernia, without obstruction or gangrene, not specified as recurrent: Secondary | ICD-10-CM | POA: Insufficient documentation

## 2013-05-20 DIAGNOSIS — R109 Unspecified abdominal pain: Secondary | ICD-10-CM

## 2013-05-20 NOTE — Assessment & Plan Note (Signed)
GS referral to consider repair of this

## 2013-05-20 NOTE — Assessment & Plan Note (Signed)
CT ordered to see if there is a stone or mass to explain this

## 2013-05-20 NOTE — Patient Instructions (Signed)

## 2013-05-20 NOTE — Progress Notes (Signed)
Subjective:    Patient ID: Jacob Garrett, male    DOB: 03-Dec-1950, 62 y.o.   MRN: [id number]  Flank Pain This is a recurrent problem. The current episode started more than 1 month ago. The problem occurs intermittently. The problem is unchanged. Pain location: right flank. The quality of the pain is described as aching. Radiates to: right groin. The pain is at a severity of 1/10. The pain is mild. The pain is worse during the day. The symptoms are aggravated by coughing (and lifting). Associated symptoms include abdominal pain. Pertinent negatives include no bladder incontinence, bowel incontinence, chest pain, dysuria, fever, headaches, leg pain, numbness, paresis, paresthesias, pelvic pain, perianal numbness, tingling, weakness or weight loss. He has tried nothing for the symptoms.      Review of Systems  Constitutional: Negative.  Negative for fever, chills, weight loss, diaphoresis, appetite change and fatigue.  HENT: Negative.   Eyes: Negative.   Respiratory: Negative.  Negative for cough, choking, chest tightness, shortness of breath, wheezing and stridor.   Cardiovascular: Negative.  Negative for chest pain, palpitations and leg swelling.  Gastrointestinal: Positive for abdominal pain. Negative for nausea, vomiting, diarrhea, constipation, blood in stool, abdominal distention, anal bleeding, rectal pain and bowel incontinence.  Endocrine: Negative.  Negative for polydipsia, polyphagia and polyuria.  Genitourinary: Positive for flank pain. Negative for bladder incontinence, dysuria, urgency, frequency, hematuria, decreased urine volume, discharge, penile swelling, scrotal swelling, enuresis, genital sores, penile pain, testicular pain and pelvic pain.  Musculoskeletal: Negative.   Skin: Negative.   Allergic/Immunologic: Negative.   Neurological: Negative.  Negative for tingling, weakness, numbness, headaches and paresthesias.  Hematological: Negative.   Psychiatric/Behavioral:  Negative.        Objective:   Physical Exam  Vitals reviewed. Constitutional: He is oriented to person, place, and time. He appears well-developed and well-nourished. No distress.  HENT:  Head: Normocephalic and atraumatic.  Mouth/Throat: Oropharynx is clear and moist. No oropharyngeal exudate.  Eyes: Conjunctivae are normal. Right eye exhibits no discharge. Left eye exhibits no discharge. No scleral icterus.  Neck: Normal range of motion. Neck supple. No JVD present. No tracheal deviation present. No thyromegaly present.  Cardiovascular: Normal rate, regular rhythm, normal heart sounds and intact distal pulses.  Exam reveals no gallop and no friction rub.   No murmur heard. Pulmonary/Chest: Effort normal and breath sounds normal. No stridor. No respiratory distress. He has no wheezes. He has no rales. He exhibits no tenderness.  Abdominal: Soft. Bowel sounds are normal. He exhibits no distension and no mass. There is no tenderness. There is no rebound and no guarding. A hernia is present. Hernia confirmed positive in the right inguinal area. Hernia confirmed negative in the left inguinal area.  Genitourinary: Testes normal and penis normal. Right testis shows no mass, no swelling and no tenderness. Right testis is descended. Left testis shows no mass, no swelling and no tenderness. Left testis is descended. Circumcised. No penile erythema or penile tenderness. No discharge found.  Musculoskeletal: Normal range of motion. He exhibits no edema and no tenderness.  Lymphadenopathy:    He has no cervical adenopathy.       Right: No inguinal adenopathy present.       Left: No inguinal adenopathy present.  Neurological: He is oriented to person, place, and time.  Skin: Skin is warm and dry. No rash noted. He is not diaphoretic. No erythema. No pallor.  Psychiatric: He has a normal mood and affect. His behavior is normal. Judgment and  thought content normal.     Lab Results  Component Value  Date   WBC 7.8 12/25/2012   HGB 16.5 12/25/2012   HCT 47.0 12/25/2012   PLT 347.0 12/25/2012   GLUCOSE 93 12/25/2012   CHOL 217* 04/02/2012   TRIG 187.0* 04/02/2012   HDL 57.70 04/02/2012   LDLDIRECT 81.1 04/02/2012   ALT 19 12/25/2012   AST 23 12/25/2012   NA 136 12/25/2012   K 4.2 12/25/2012   CL 103 12/25/2012   CREATININE 0.9 12/25/2012   BUN 19 12/25/2012   CO2 31 12/25/2012   TSH 1.11 04/02/2012   PSA 3.73 12/25/2012   HGBA1C 5.8 04/03/2010       Assessment & Plan:

## 2013-05-25 ENCOUNTER — Ambulatory Visit (INDEPENDENT_AMBULATORY_CARE_PROVIDER_SITE_OTHER)
Admission: RE | Admit: 2013-05-25 | Discharge: 2013-05-25 | Disposition: A | Discharge status: Home / Self Care | Payer: BC Managed Care – PPO | Source: Ambulatory Visit | Attending: Internal Medicine | Admitting: Internal Medicine

## 2013-05-25 ENCOUNTER — Ambulatory Visit (INDEPENDENT_AMBULATORY_CARE_PROVIDER_SITE_OTHER): Payer: BC Managed Care – PPO | Admitting: General Surgery

## 2013-05-25 ENCOUNTER — Encounter (INDEPENDENT_AMBULATORY_CARE_PROVIDER_SITE_OTHER): Payer: Self-pay | Admitting: General Surgery

## 2013-05-25 VITALS — BP 116/98 | HR 71 | Temp 97.3°F | Ht 69.5 in | Wt 173.6 lb

## 2013-05-25 DIAGNOSIS — R109 Unspecified abdominal pain: Secondary | ICD-10-CM

## 2013-05-25 DIAGNOSIS — K4090 Unilateral inguinal hernia, without obstruction or gangrene, not specified as recurrent: Secondary | ICD-10-CM | POA: Insufficient documentation

## 2013-05-25 MED ORDER — IOHEXOL 300 MG/ML  SOLN
100.0000 mL | Freq: Once | INTRAMUSCULAR | Status: AC | PRN
  Administered 2013-05-25: 100 mL via INTRAVENOUS

## 2013-05-25 NOTE — Patient Instructions (Signed)
You have a reducible right inguinal hernia which has been causing increasing pain.  You will be scheduled for elective open repair of your right inguinal hernia with mesh.      Inguinal Hernia, Adult Muscles help keep everything in the body in its proper place. But if a weak spot in the muscles develops, something can poke through. That is called a hernia. When this happens in the lower part of the belly (abdomen), it is called an inguinal hernia. (It takes its name from a part of the body in this region called the inguinal canal.) A weak spot in the wall of muscles lets some fat or part of the small intestine bulge through. An inguinal hernia can develop at any age. Men get them more often than women. CAUSES  In adults, an inguinal hernia develops over time.  It can be triggered by:  Suddenly straining the muscles of the lower abdomen.  Lifting heavy objects.  Straining to have a bowel movement. Difficult bowel movements (constipation) can lead to this.  Constant coughing. This may be caused by smoking or lung disease.  Being overweight.  Being pregnant.  Working at a job that requires long periods of standing or heavy lifting.  Having had an inguinal hernia before. One type can be an emergency situation. It is called a strangulated inguinal hernia. It develops if part of the small intestine slips through the weak spot and cannot get back into the abdomen. The blood supply can be cut off. If that happens, part of the intestine may die. This situation requires emergency surgery. SYMPTOMS  Often, a small inguinal hernia has no symptoms. It is found when a healthcare provider does a physical exam. Larger hernias usually have symptoms.   In adults, symptoms may include:  A lump in the groin. This is easier to see when the person is standing. It might disappear when lying down.  In men, a lump in the scrotum.  Pain or burning in the groin. This occurs especially when lifting,  straining or coughing.  A dull ache or feeling of pressure in the groin.  Signs of a strangulated hernia can include:  A bulge in the groin that becomes very painful and tender to the touch.  A bulge that turns red or purple.  Fever, nausea and vomiting.  Inability to have a bowel movement or to pass gas. DIAGNOSIS  To decide if you have an inguinal hernia, a healthcare provider will probably do a physical examination.  This will include asking questions about any symptoms you have noticed.  The healthcare provider might feel the groin area and ask you to cough. If an inguinal hernia is felt, the healthcare provider may try to slide it back into the abdomen.  Usually no other tests are needed. TREATMENT  Treatments can vary. The size of the hernia makes a difference. Options include:  Watchful waiting. This is often suggested if the hernia is small and you have had no symptoms.  No medical procedure will be done unless symptoms develop.  You will need to watch closely for symptoms. If any occur, contact your healthcare provider right away.  Surgery. This is used if the hernia is larger or you have symptoms.  Open surgery. This is usually an outpatient procedure (you will not stay overnight in a hospital). An cut (incision) is made through the skin in the groin. The hernia is put back inside the abdomen. The weak area in the muscles is then repaired by  herniorrhaphy or hernioplasty. Herniorrhaphy: in this type of surgery, the weak muscles are sewn back together. Hernioplasty: a patch or mesh is used to close the weak area in the abdominal wall.  Laparoscopy. In this procedure, a surgeon makes small incisions. A thin tube with a tiny video camera (called a laparoscope) is put into the abdomen. The surgeon repairs the hernia with mesh by looking with the video camera and using two long instruments. HOME CARE INSTRUCTIONS   After surgery to repair an inguinal hernia:  You will need  to take pain medicine prescribed by your healthcare provider. Follow all directions carefully.  You will need to take care of the wound from the incision.  Your activity will be restricted for awhile. This will probably include no heavy lifting for several weeks. You also should not do anything too active for a few weeks. When you can return to work will depend on the type of job that you have.  During "watchful waiting" periods, you should:  Maintain a healthy weight.  Eat a diet high in fiber (fruits, vegetables and whole grains).  Drink plenty of fluids to avoid constipation. This means drinking enough water and other liquids to keep your urine clear or pale yellow.  Do not lift heavy objects.  Do not stand for long periods of time.  Quit smoking. This should keep you from developing a frequent cough. SEEK MEDICAL CARE IF:   A bulge develops in your groin area.  You feel pain, a burning sensation or pressure in the groin. This might be worse if you are lifting or straining.  You develop a fever of more than 100.5 F (38.1 C). SEEK IMMEDIATE MEDICAL CARE IF:   Pain in the groin increases suddenly.  A bulge in the groin gets bigger suddenly and does not go down.  For men, there is sudden pain in the scrotum. Or, the size of the scrotum increases.  A bulge in the groin area becomes red or purple and is painful to touch.  You have nausea or vomiting that does not go away.  You feel your heart beating much faster than normal.  You cannot have a bowel movement or pass gas.  You develop a fever of more than 102.0 F (38.9 C). Document Released: 12/30/2008 Document Revised: 11/05/2011 Document Reviewed: 12/30/2008 [facility] Patient Information 2014 [place], [place].

## 2013-05-25 NOTE — Progress Notes (Signed)
Patient ID: Jacob Garrett, male   DOB: Sep 17, 1950, 62 y.o.   MRN: [id number]  Chief Complaint  Patient presents with  . New Evaluation    eval ing hernia    HPI Jacob Garrett is a 62 y.o. male.  He is referred by Dr. Sanda Linger for evaluation of a symptomatic right inguinal hernia.  Patient has no prior history of hernia. 2 weeks ago he developed pain in his right groin. It has been progressive and occasionally severe. He says he is having a hard time getting through his work day where he works in a [employer]. 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 He says he has felt a small bulge.  He is otherwise generally healthy. Quit smoking 14 years ago. Mild BPH. Married with 3 children.  HPI  Past Medical History  Diagnosis Date  . Cluster headache 1990    History reviewed. No pertinent past surgical history.  Family History  Problem Relation Age of Onset  . Hypertension Mother   . Diabetes Mother   . Alcohol abuse Neg Hx   . Arthritis Neg Hx   . Cancer Neg Hx   . Early death Neg Hx   . Heart disease Neg Hx   . Hyperlipidemia Neg Hx   . Kidney disease Neg Hx   . Stroke Neg Hx     Social History History  Substance Use Topics  . Smoking status: Former Games developer  . Smokeless tobacco: Former Neurosurgeon    Quit date: 05/26/1999  . Alcohol Use: 0.6 oz/week    1 Glasses of wine per week     Comment: weekly    Allergies  Allergen Reactions  . Penicillins     REACTION: Childhood reaction unkown    No current outpatient prescriptions on file.   No current facility-administered medications for this visit.    Review of Systems Review of Systems  Constitutional: Negative for fever, chills and unexpected weight change.  HENT: Negative for hearing loss, congestion, sore throat, trouble swallowing and voice change.   Eyes: Negative for visual disturbance.  Respiratory: Negative for cough and wheezing.   Cardiovascular: Negative for chest  pain, palpitations and leg swelling.  Gastrointestinal: Positive for abdominal pain. Negative for nausea, vomiting, diarrhea, constipation, blood in stool, abdominal distention, anal bleeding and rectal pain.  Genitourinary: Negative for hematuria and difficulty urinating.  Musculoskeletal: Negative for arthralgias.  Skin: Negative for rash and wound.  Neurological: Negative for seizures, syncope, weakness and headaches.  Hematological: Negative for adenopathy. Does not bruise/bleed easily.  Psychiatric/Behavioral: Negative for confusion.    Blood pressure 116/98, pulse 71, temperature 97.3 F (36.3 C), temperature source Temporal, height 5' 9.5" (1.765 m), weight 173 lb 9.6 oz (78.744 kg), SpO2 98.00%.  Physical Exam Physical Exam  Constitutional: He is oriented to person, place, and time. He appears well-developed and well-nourished. No distress.  HENT:  Head: Normocephalic.  Nose: Nose normal.  Mouth/Throat: No oropharyngeal exudate.  Eyes: Conjunctivae and EOM are normal. Pupils are equal, round, and reactive to light. Right eye exhibits no discharge. Left eye exhibits no discharge. No scleral icterus.  Neck: Normal range of motion. Neck supple. No JVD present. No tracheal deviation present. No thyromegaly present.  Cardiovascular: Normal rate, regular rhythm, normal heart sounds and intact distal pulses.   No murmur heard. Pulmonary/Chest: Effort normal and breath sounds normal. No stridor. No respiratory distress. He has no wheezes. He has no rales. He exhibits no  tenderness.  Abdominal: Soft. Bowel sounds are normal. He exhibits no distension and no mass. There is no tenderness. There is no rebound and no guarding.  Genitourinary:  Small but tender right inguinal hernia. Reducible. No hernia on the left. Umbilicus normal. Penis scrotum and testes normal.  Musculoskeletal: Normal range of motion. He exhibits no edema and no tenderness.  Lymphadenopathy:    He has no cervical  adenopathy.  Neurological: He is alert and oriented to person, place, and time. He has normal reflexes. Coordination normal.  Skin: Skin is warm and dry. No rash noted. He is not diaphoretic. No erythema. No pallor.  Psychiatric: He has a normal mood and affect. His behavior is normal. Judgment and thought content normal.    Data Reviewed Recent office notes from Dr. Yetta Barre.  Assessment    Symptomatic right inguinal hernia, reducible. Patient desires repair due to interference with work and recreational activities  Mild BPH  Former smoker     Plan    We had a long conversation about open repair with mesh, a laparoscopic repair with mesh. That risks and complications. The temporary disability and returned activities.  The at the end of a long discussion he decided he would to simply like to go ahead with an open repair with mesh. This will be scheduled at his convenience.  I discussed the indications, details, techniques, and numerous risk of the surgery with him. He is aware of the risk of bleeding, infection, recurrence, nerve damage with chronic pain or numbness, injury to the bladder or testicle or intestine which is unlikely but possible, and other acute problems. He knows that he will not be able to participate in  sports or do heavy lifting for at least a month. At this time all his questions were answered and he understands all these issues. He agrees with this plan.        Angelia Mould. Derrell Lolling, M.D., [facility] General and Minimally invasive Surgery Breast and Colorectal Surgery Office:   [phone] Pager:   [phone]  05/25/2013, 4:21 PM

## 2013-05-26 ENCOUNTER — Telehealth: Payer: Self-pay | Admitting: Internal Medicine

## 2013-05-26 NOTE — Telephone Encounter (Signed)
Spoke with pts wife. Advised of MDs message. 

## 2013-05-26 NOTE — Telephone Encounter (Signed)
Patients wife requesting call back with results of the patients MRI from yesterday

## 2013-05-26 NOTE — Telephone Encounter (Signed)
They need to call the surgeon that ordered the CT scan

## 2013-05-26 NOTE — Telephone Encounter (Signed)
Please advise on CT Thanks

## 2013-05-26 NOTE — Telephone Encounter (Signed)
Correction - the CT scan just showed the hernia there was nothing that explained the pain

## 2013-05-28 ENCOUNTER — Telehealth (INDEPENDENT_AMBULATORY_CARE_PROVIDER_SITE_OTHER): Payer: Self-pay

## 2013-05-28 NOTE — Telephone Encounter (Signed)
I returned patient's call.  I let him know that we don't usually prescribe patients preop medication.  I advised he should try Tylenol or Advil for pain.  Stop Advil 5 days before surgery.  Call if pain worsens.

## 2013-05-28 NOTE — Telephone Encounter (Signed)
Message copied by Ivory Broad on Thu May 28, 2013  3:11 PM ------      Message from: Mervin Kung      Created: Thu May 28, 2013  2:52 PM      Contact: [phone]       [place] Dec,       Pt called states he is scheduled for SX on the 28th of this month but failed to speak with Dr Derrell Lolling about pain meds.  He would like a call back at listed number he also uses [facility].             sonya ------

## 2013-06-11 LAB — HM COLONOSCOPY

## 2013-06-23 ENCOUNTER — Other Ambulatory Visit (INDEPENDENT_AMBULATORY_CARE_PROVIDER_SITE_OTHER): Payer: Self-pay | Admitting: *Deleted

## 2013-06-23 DIAGNOSIS — K4090 Unilateral inguinal hernia, without obstruction or gangrene, not specified as recurrent: Secondary | ICD-10-CM

## 2013-06-23 MED ORDER — HYDROCODONE-ACETAMINOPHEN 5-325 MG PO TABS: 1.0000 | ORAL_TABLET | ORAL | Status: DC | PRN

## 2013-06-25 ENCOUNTER — Telehealth (INDEPENDENT_AMBULATORY_CARE_PROVIDER_SITE_OTHER): Payer: Self-pay

## 2013-06-25 ENCOUNTER — Telehealth (INDEPENDENT_AMBULATORY_CARE_PROVIDER_SITE_OTHER): Payer: Self-pay | Admitting: Surgery

## 2013-06-25 NOTE — Telephone Encounter (Signed)
Pt's wife called b/c she is so concerned about his swelling after an open ing hernia repair done 06/23/13. The pt is having swelling in his testicles and his abdomen is starting to be come distended. The pt has not had a BM since surgery and is passing little gas. The pt has taken a stool softner and one dose of Miralax but no response. I advised pt that he needed to take an otc Milk of Magnesia 4 Tbls along with plenty of fluids. I advised pt that if nothing happens in several hours they might have to repeat the dose of Milk of Magnesia. The wife said the pt has been up walking for about an hour today trying to get the gas to come out and wanted to know if he had done something wrong. I advised her that walking was fine that we recommend walking after surgery. I advised them to keep using the ice for the swelling. I advised things to look for if things should get worse not better nausea,vomiting, and running fevers. They understand.

## 2013-06-25 NOTE — Telephone Encounter (Signed)
I spoke to wife about her husband's distention.  He is two days out from a lap inguinal hernia repair.  I told her for her husband to stop the narcotoics (Vicodin) if possible, use NSAID for pain (ibuprofen/aleve), and take only liquids.  He is having no fever, but he does have abdominal pain from the bloating.  She is going to call the office tomorrow to let us know how he is doing.  D. [facility]

## 2013-06-26 ENCOUNTER — Telehealth (INDEPENDENT_AMBULATORY_CARE_PROVIDER_SITE_OTHER): Payer: Self-pay | Admitting: General Surgery

## 2013-06-26 NOTE — Telephone Encounter (Signed)
Patient called back after getting a call from our office. He stated that he is feeling a lot better this morning and wanted to Thank Dr Ezzard Standing for having his office call him this morning to check on him

## 2013-06-26 NOTE — Telephone Encounter (Signed)
Unless much better, get CBC and bring to urgent office Frioday.  hmi

## 2013-06-26 NOTE — Telephone Encounter (Signed)
The pt called this morning and spoke to Vergas.  He is doing much better today.  He spoke to Dr Ezzard Standing last night.  No appointment needed.

## 2013-06-26 NOTE — Telephone Encounter (Signed)
I called and left a message on both the patient's and wife's cell to check on the pt this morning.

## 2013-07-07 ENCOUNTER — Encounter (INDEPENDENT_AMBULATORY_CARE_PROVIDER_SITE_OTHER): Payer: Self-pay

## 2013-07-07 ENCOUNTER — Encounter (INDEPENDENT_AMBULATORY_CARE_PROVIDER_SITE_OTHER): Payer: Self-pay | Admitting: General Surgery

## 2013-07-07 ENCOUNTER — Ambulatory Visit (INDEPENDENT_AMBULATORY_CARE_PROVIDER_SITE_OTHER): Payer: BC Managed Care – PPO | Admitting: General Surgery

## 2013-07-07 VITALS — BP 114/80 | HR 68 | Temp 98.7°F | Resp 14 | Ht 70.0 in | Wt 173.6 lb

## 2013-07-07 DIAGNOSIS — K4090 Unilateral inguinal hernia, without obstruction or gangrene, not specified as recurrent: Secondary | ICD-10-CM

## 2013-07-07 NOTE — Progress Notes (Signed)
Patient ID: Jacob Garrett, male   DOB: November 05, 1950, 62 y.o.   MRN: [id number] History: This gentleman underwent laparoscopic repair of right inguinal hernia with mesh on 06/23/2013. He says he is doing very well and has no complaints. Minimal bruising. Minimal swelling. Bowel and bladder habits normal.  Exam: Patient looks well. No distress Abdomen soft and nontender. Umbilical incision or incisions healing normally. Right inguinal area slightly tender. No fluid. No hematoma. No hernia.  Assessment: Right inguinal hernia, recovering uneventfully in the early postop period following laparoscopic repair with mesh  Plan: Diet and activities discussed. No sports or heavy lifting for 2 more weeks Return to see me as needed.   Angelia Mould. Derrell Lolling, M.D., [facility] General and Minimally invasive Surgery Breast and Colorectal Surgery Office:   [phone] Pager:   [phone]

## 2013-07-07 NOTE — Patient Instructions (Signed)
You are recovering from your laparoscopic right inguinal hernia repair with mesh without any obvious surgical complications.  Restrict your activities for 2 more weeks as discussed.  You may resume normal activities after November 28  Stretch daily and take a 1-2 mile walk daily  Return to see Dr. Derrell Lolling if there are any problems.

## 2013-07-21 ENCOUNTER — Ambulatory Visit
Admission: RE | Admit: 2013-07-21 | Discharge: 2013-07-21 | Disposition: A | Discharge status: Home / Self Care | Payer: BC Managed Care – PPO | Source: Ambulatory Visit | Attending: Sports Medicine | Admitting: Sports Medicine

## 2013-07-21 ENCOUNTER — Other Ambulatory Visit: Payer: Self-pay | Admitting: Sports Medicine

## 2013-07-21 ENCOUNTER — Ambulatory Visit (INDEPENDENT_AMBULATORY_CARE_PROVIDER_SITE_OTHER): Payer: BC Managed Care – PPO | Admitting: Sports Medicine

## 2013-07-21 ENCOUNTER — Encounter: Payer: Self-pay | Admitting: Sports Medicine

## 2013-07-21 VITALS — BP 148/99 | Ht 70.0 in | Wt 175.0 lb

## 2013-07-21 DIAGNOSIS — R109 Unspecified abdominal pain: Secondary | ICD-10-CM

## 2013-07-21 DIAGNOSIS — M1990 Unspecified osteoarthritis, unspecified site: Secondary | ICD-10-CM

## 2013-07-21 DIAGNOSIS — R079 Chest pain, unspecified: Secondary | ICD-10-CM

## 2013-07-21 DIAGNOSIS — R0781 Pleurodynia: Secondary | ICD-10-CM

## 2013-07-21 MED ORDER — GABAPENTIN 300 MG PO CAPS: 300.0000 mg | ORAL_CAPSULE | Freq: Every day | ORAL | Status: DC

## 2013-07-21 NOTE — Progress Notes (Signed)
Subjective:    Patient ID: Jacob Garrett is a 62 y.o. male.referred courtesy of Dr Renne Crigler  Chief Complaint: HPI The patient is a pleasant 62 yo male who presents today for a MSK evaluation of his persistent right rib/ flank pain that has been present for over 1 year. Patient was seen by and had an extensive work up for metabolic cause increase normal UA, BMP, normal rib xray with no fracture, and CT ABD with no liver or kidney pathology that would represent his pain. He describes the pain as a dull ache rated 3/10 and constant, no sharp or shooting pain, no increase pain with any position. He has not done any medication management or PT for this pain. Denies chest wall pain, SOB, chest tightness, dysuria, or abdominal pain. He does have some occasional right sided thoracic pain and reports a muscle sprain in the mid thoracic region several years ago.    Social History   Occupational History  . Not on file.   Social History Main Topics  . Smoking status: Former Games developer  . Smokeless tobacco: Former Neurosurgeon    Quit date: 05/26/1999  . Alcohol Use: 0.6 oz/week    1 Glasses of wine per week     Comment: weekly  . Drug Use: Yes    Special: Marijuana  . Sexual Activity: Yes    Family Hx: mother with DM, CAD and HTN ROS  Negative except what is present in HPI     Objective:   Ortho Exam  General: Alert and oriented and in NAD. Respiratory:Normal respiratory effort without use of accessory muscles. Skin: Warm, dry, intact.  No rashes, lesions, ecchymosis, erythema. Neuro: A&O x3.  Sensation to light touch upper extremities equal and intact bilaterally. Right hand dominant back exam: Observation: Normal curvature and no kyphosis or lordosis, no scoliosis.  Iliac crests are symmetric, shoulders line symmetrically Palpation: No step off defects noted in the thoracic or lumbar spine.  Mild to moderate muscle spasm and tenderness along the paraspinal musculature of the thoracic spine particular at  T9-T11.  Range of motion: Normal flexion on forward bending, no pain with extension. Pain that radiate into the right lateral rib cage only the T9-T10 nerve root with side bending on the right side. No pain or radiate with shoulder compression down the spine, no pain with left side bending.     ABD CT evaluation: after reviewing the patient abdominal CT there is evidence of Thoracic vertebral sclerotic changes particular on the right side with possible fusion between T9-10 and T10-11.   Assessment:     1. Rib pain on right side   2. Sclerotic changes to the right vertebral bodies at T9-T11     Plan:    Based on the patient's CT evidence of DJD changes along the right thoracic spine and the reproducible pain with thoracic side bending that radiate pain along the right lower rib cage. We suspect that the source of his right rib cage pain is generated from nerve impingement within the thoracic outlet. At this time will started gabapentin 300mg  QHS for neuropathic pain, obtain a thoracic xray to exam the entire thoracic spine for signs of DJD follow up in 4 weeks pending xray results.

## 2013-07-21 NOTE — Assessment & Plan Note (Addendum)
there is some significant spondylolysis of facets and lateral margins of thoracic vertebrae particularly in lower thoracic spine on CT  Check Xray of thoracic spine ---  This shows diffuse scerosis and bridging of thoracic spine more along RT lateral spine but also along anterior margins  This is extensive and whether there is any association with ankylosing spondylitis is unclear    If pain becomes significant would check HLA B 27 and CBC, ESR  We will follow to see if we need to add NSAID or tramadol to his gabapentin

## 2013-07-21 NOTE — Assessment & Plan Note (Signed)
This is likely referred from spine  Trial on gabapentin Qhs  Reck P 1 mo  Confirmatory Xrays

## 2013-08-03 DIAGNOSIS — N400 Enlarged prostate without lower urinary tract symptoms: Secondary | ICD-10-CM | POA: Insufficient documentation

## 2013-08-03 DIAGNOSIS — R972 Elevated prostate specific antigen [PSA]: Secondary | ICD-10-CM | POA: Insufficient documentation

## 2013-08-06 IMAGING — CR DG ABDOMEN ACUTE W/ 1V CHEST
3 series · 3 of 3 positions shown · non-contrast
Comparison: None

CLINICAL DATA: Right axillary pain for several months, former
smoker

ACUTE ABDOMEN SERIES (ABDOMEN 2 VIEW & CHEST 1 VIEW)

[view not recorded (1 of 3)]
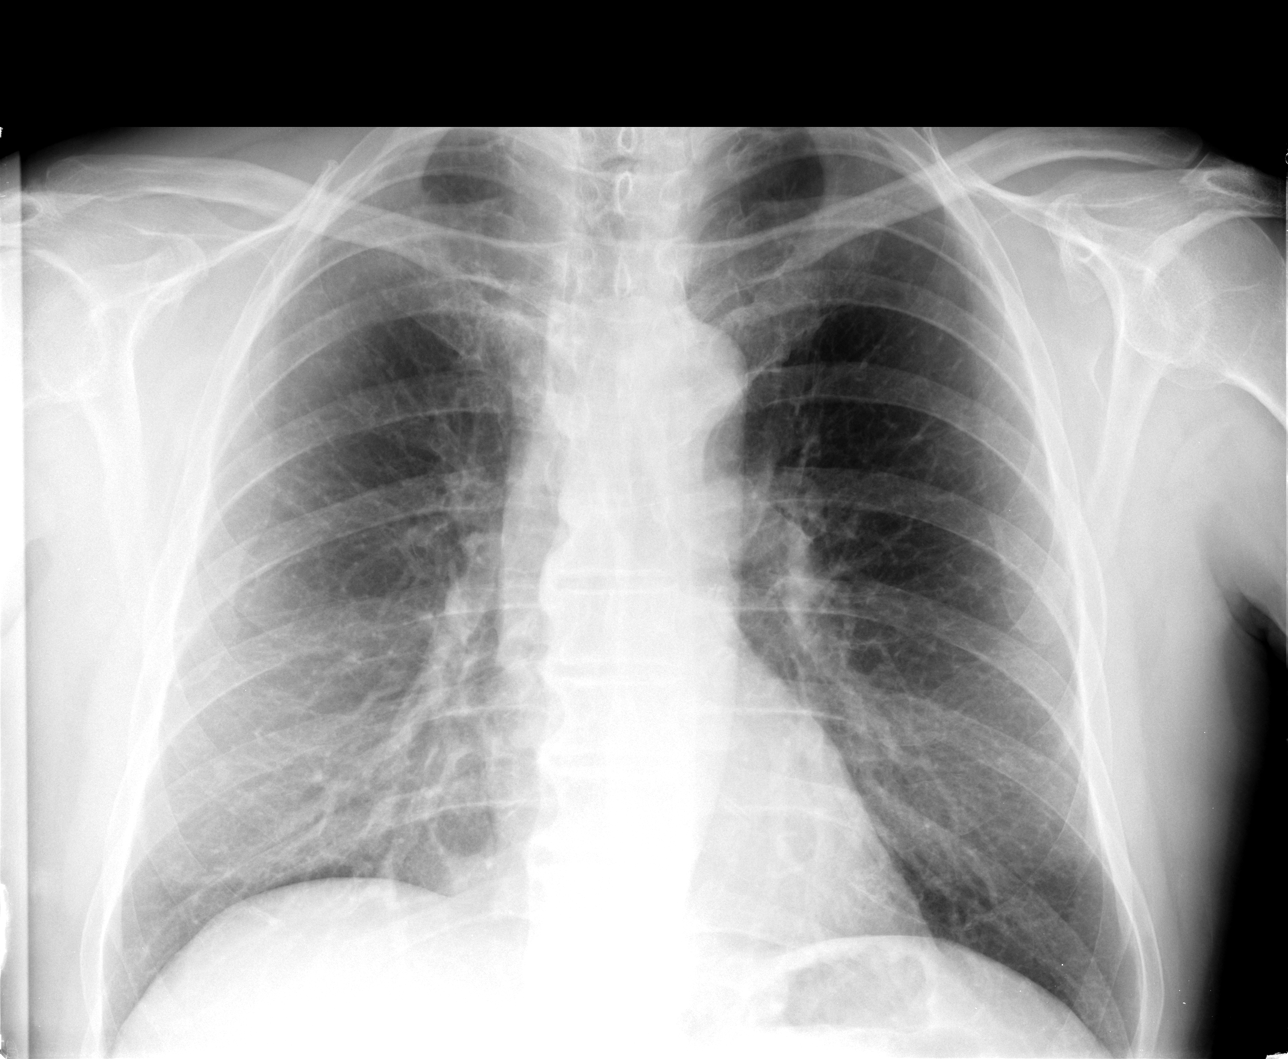

[view not recorded (2 of 3)]
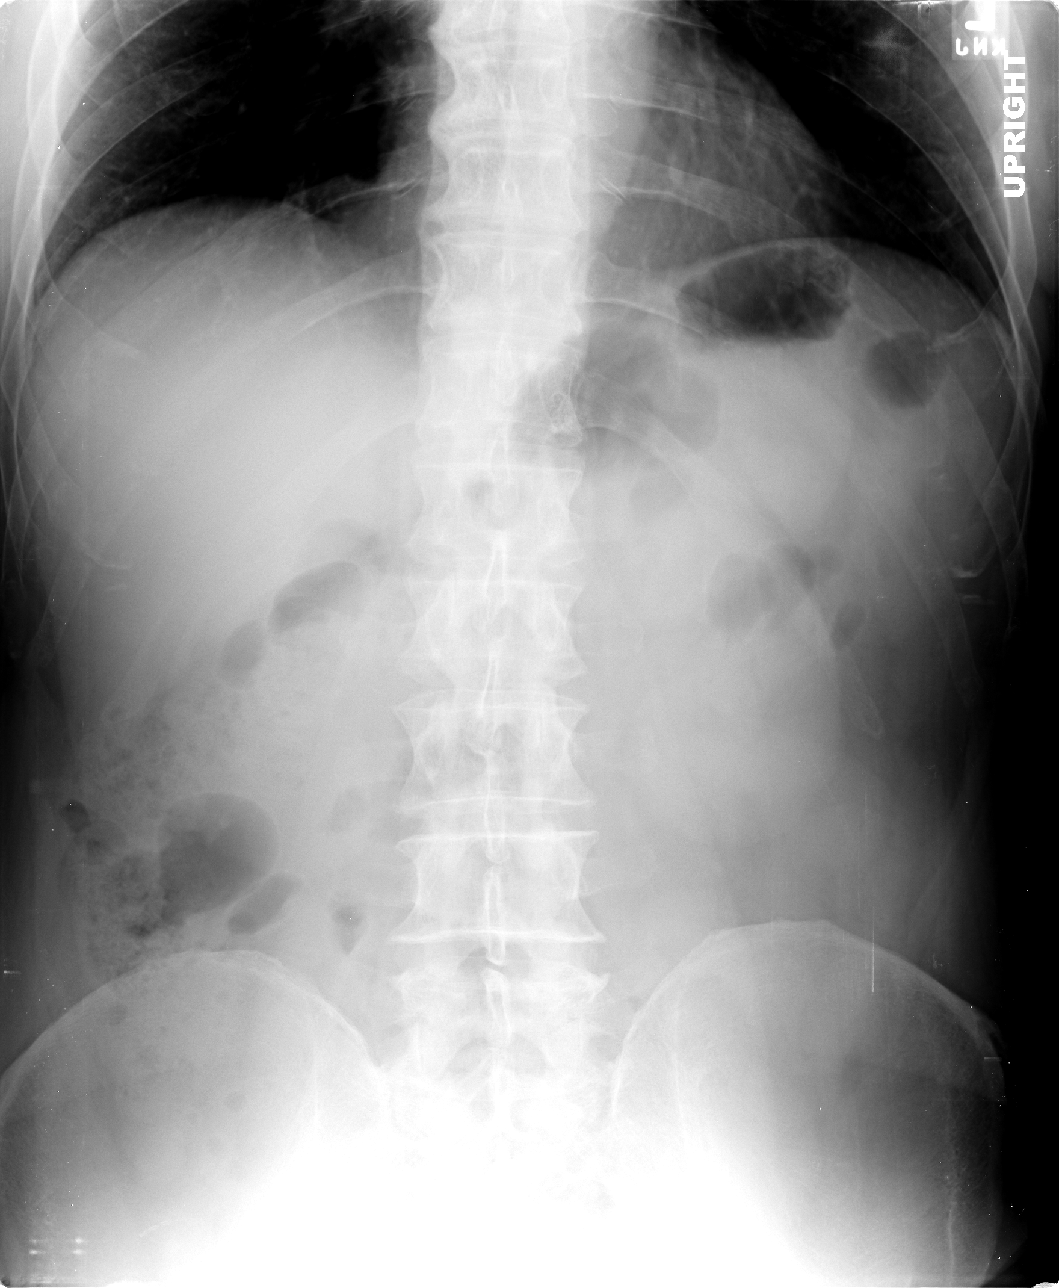

[view not recorded (3 of 3)]
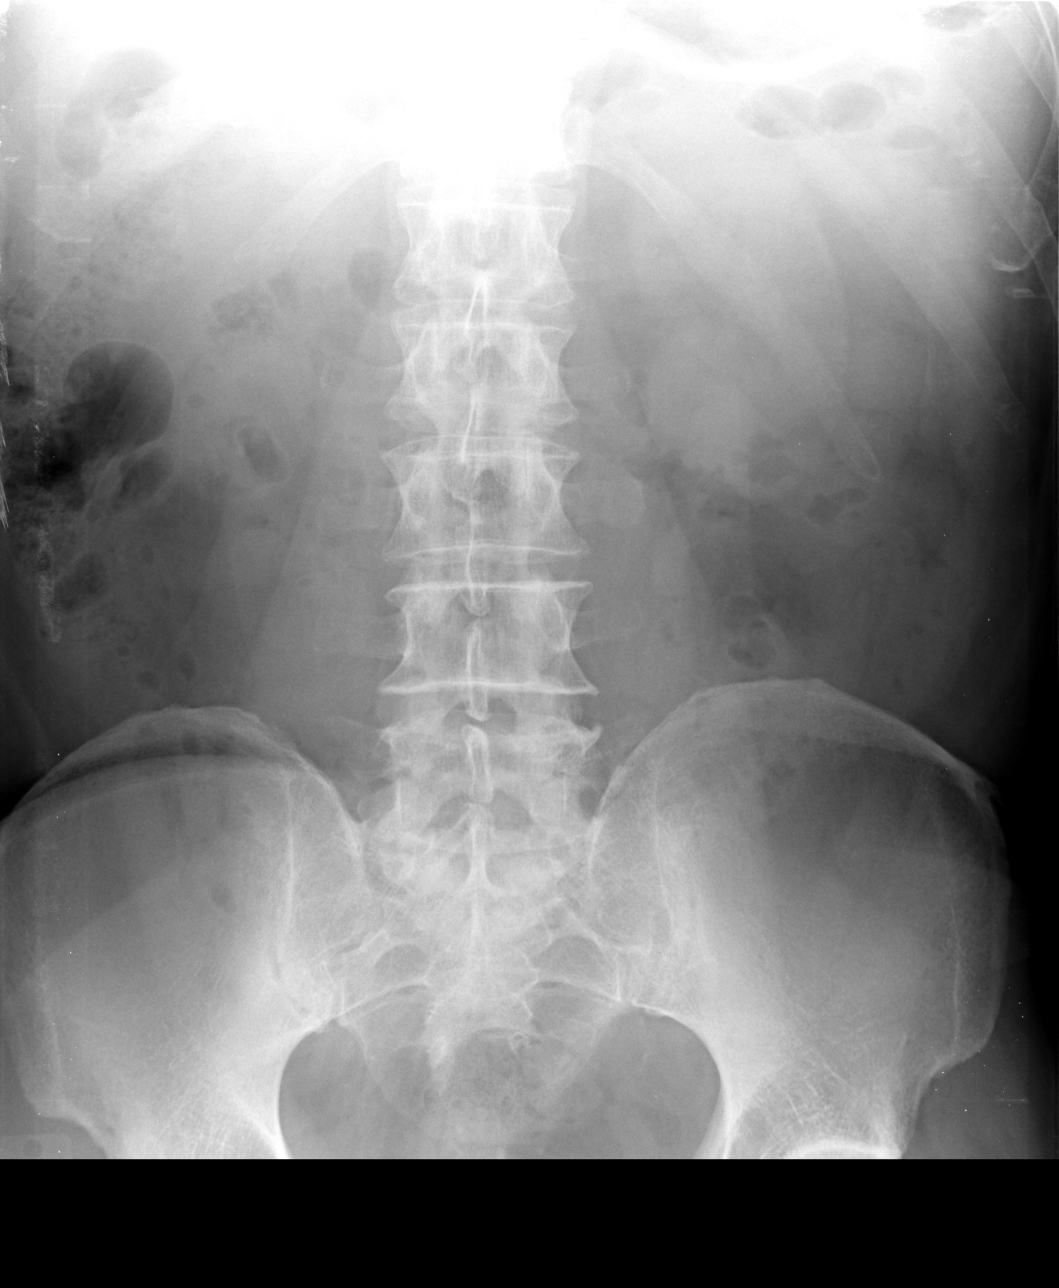

[3 of 3 positions shown; findings below may reference images not displayed]

FINDINGS: Normal heart size, mediastinal contours, and pulmonary vascularity.
Bones appear mildly emphysematous but clear.
No pleural effusion or pneumothorax.
Normal bowel gas pattern.
No bowel dilatation, bowel wall thickening, or free intraperitoneal
air.
Minimal scattered degenerative disc disease changes thoracolumbar
spine.
No urinary tract calcifications identified.
IMPRESSION: No acute abnormalities.
Question underlying emphysematous changes.

## 2013-08-17 ENCOUNTER — Encounter: Payer: Self-pay | Admitting: Sports Medicine

## 2013-08-17 ENCOUNTER — Ambulatory Visit (INDEPENDENT_AMBULATORY_CARE_PROVIDER_SITE_OTHER): Payer: BC Managed Care – PPO | Admitting: Sports Medicine

## 2013-08-17 VITALS — BP 171/84 | Ht 70.0 in | Wt 175.0 lb

## 2013-08-17 DIAGNOSIS — M47814 Spondylosis without myelopathy or radiculopathy, thoracic region: Secondary | ICD-10-CM

## 2013-08-17 NOTE — Assessment & Plan Note (Signed)
Given stetches to open thoracic spine  Extension stretches as well  Watch posture  Reck with me in 6 mos

## 2013-08-17 NOTE — Patient Instructions (Signed)
Continue lateral stretching to the left side   Try lying flat on the floor holding light weight dumbbells overhead   Concentrate on posture- holding shoulders back and neck up  Please follow up in 6 months or as needed  Thank you for seeing [place] today!

## 2013-08-17 NOTE — Progress Notes (Signed)
   Subjective:    Patient ID: Jacob Garrett, male    DOB: 1951/08/18, 62 y.o.   MRN: [id number]  HPI  Pt presents to clinic for f/u of rt side rib and flank pain which is improving. Found significant arthritis in the spine at last visit and convex curve to the right side. He did not start gabapentin.  Has been doing gentle lateral stretch to the left. These actually cut his pain down to where he no longer radiates to his ribs along RT  Review of Systems     Objective:   Physical Exam NAD  Lumbar spine convex to the right and thoracic spine convex to the left Rt rotation and bend no pain Lt rotation and bend Mid thoracic level T6  Xray reviewed and shows bridgind along mid thoracic spine along RT side of vertebrae as well as along anterior      Assessment & Plan:

## 2014-08-12 ENCOUNTER — Encounter: Payer: Self-pay | Admitting: Sports Medicine

## 2014-08-12 ENCOUNTER — Ambulatory Visit (INDEPENDENT_AMBULATORY_CARE_PROVIDER_SITE_OTHER): Payer: BC Managed Care – PPO | Admitting: Sports Medicine

## 2014-08-12 VITALS — BP 121/85 | HR 87 | Ht 70.0 in | Wt 170.0 lb

## 2014-08-12 DIAGNOSIS — M25512 Pain in left shoulder: Secondary | ICD-10-CM | POA: Diagnosis not present

## 2014-08-12 DIAGNOSIS — M47814 Spondylosis without myelopathy or radiculopathy, thoracic region: Secondary | ICD-10-CM | POA: Diagnosis not present

## 2014-08-12 MED ORDER — METHYLPREDNISOLONE ACETATE 40 MG/ML IJ SUSP
40.0000 mg | Freq: Once | INTRAMUSCULAR | Status: AC
Start: 2014-08-12 — End: 2014-08-12
  Administered 2014-08-12: 40 mg via INTRA_ARTICULAR

## 2014-08-12 NOTE — Assessment & Plan Note (Addendum)
49 yr M with PMH significant T-Spine OA with significant DJD and anterior bridging mid-distal T-spine with reduced flexibility, presents with acute Left shoulder pain with gradual worsening x 3 weeks with significant repetitive activities. Exam and bedside US suggestive of Left rotator cuff tendinitis and impingement due to supraspinatus tendon bone spur, without evidence of tear (not on [place], however likely given pt age >70), likely secondary to compensation due to limited mobility at T-spine  Plan: 1. Given Left shoulder subacromial steroid injection today 2. Demonstrated / handout scapular stability exercises (start 3 days after injection) in addition to current Thoracic exercises that do not cause pain to L shoulder 3. RTC 6 weeks, if persistent problems recommend L shoulder X-ray

## 2014-08-12 NOTE — Progress Notes (Signed)
Subjective:    Patient ID: Jacob Garrett, male    DOB: 1950-10-02, 63 y.o.   MRN: [id number]  HPI  LEFT SHOULDER PAIN: - Patient reports Left Shoulder pain for past 3 weeks, no initial injury, pain described as gradual worsening, started with sharp pain over anterolateral shoulder with progression to "deeper joint aching" within shoulder, pain is minimal at rest, but worsens with extremes of movements (especially moving arm across body, seat belt), associated with worse pain at night laying directly on either shoulder, pain wakes up from sleep some difficulty falling asleep d/t pain. 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 No prior imaging of Left shoulder. Significant history of thoracici DJD consistent with DISH - Not currently taking any medications for pain. Not tried NSAIDs. No stretching or exercises for this. - Denies any neck pain, radiating pain, numbness, weakness, or tingling  PMH: - OA, Thoracic Spine  I have reviewed and updated the following as appropriate: allergies and current medications  Social Hx: - Former smoker  Review of Systems  See above HPI    Objective:   Physical Exam  BP 121/85 mmHg  Pulse 87  Ht 5\' 10"  (1.778 m)  Wt 170 lb (77.111 kg)  BMI 24.39 kg/m2  Gen - well-appearing, NAD Neck - full ROM without tenderness to palpation MSK: - Left Shoulder - bilateral shoulders with forward rotating scapular appearance otherwise muscles appear symmetrical w/o obvious deformity, localized tenderness over anterolateral aspect otherwise AC and biceps tendon normal, full active ROM flex, ext, int / ext rotation, muscle strength intact 5/5 with all static position testing, limited due to pain with positive rotator cuff testing  empty can and Hawkin's impingement. Left scapula with mild winging/ protraction Skin - warm, dry Neuro - intact distal sensation to light touch, distal grip str b/l 5/5  Bedside Ultrasound: - LEFT SHOULDER: No evidence of rotator cuff tear. Identified small calcification / spur appeared to be within supraspinatus tendon however w/o obvious limitation of subacromial movement. Otherwise, biceps tendon appears intact w/o edema or inflammation. AC joint appears normal. GH joint with good motion.  LEFT SUBACROMIAL INJECTION: Consent obtained and verified. Time-out conducted. Noted no overlying erythema, induration, or other signs of local infection. Skin prepped with alcohol. Topical analgesic spray: Ethyl chloride. Joint: Left Shoulder, subacromial/  Approach from post window without difficulty Needle: 25g Completed without difficulty. Meds: 4 cc 1% Lidocaine, 1 cc Solumedrol 40mg  Tolerated procedure well. Advised to call if red flag symptoms, anticipate discomfort x 24-48 hours.     Assessment & Plan:   50 yr M with PMH significant T-Spine OA with significant DJD and anterior bridging mid-distal T-spine with reduced flexibility, presents with acute Left shoulder pain with gradual worsening x 3 weeks with significant repetitive activities. Exam and bedside US suggestive of Left rotator cuff tendinitis and impingement due to supraspinatus tendon bone spur, without evidence of tear (not on [place], however likely given pt age >27), likely secondary to compensation due to limited mobility at T-spine  Plan: 1. Given Left shoulder subacromial steroid injection today 2. Demonstrated / handout scapular stability exercises (start 3 days after injection) in addition to current Thoracic exercises that do not cause pain to L shoulder 3. RTC 6 weeks, if persistent problems recommend L shoulder X-ray  Nobie Putnam, [facility], PGY-2  agree with assessment I directly supervised  the exam and injeciton  Ila Mcgill, MD

## 2014-08-12 NOTE — Assessment & Plan Note (Signed)
I think this contributes to his abnormal shoulder position  Exercises have helped

## 2014-08-24 ENCOUNTER — Other Ambulatory Visit: Payer: Self-pay | Admitting: Orthopedic Surgery

## 2014-09-23 ENCOUNTER — Ambulatory Visit: Payer: BC Managed Care – PPO | Admitting: Sports Medicine

## 2014-09-28 ENCOUNTER — Encounter (HOSPITAL_BASED_OUTPATIENT_CLINIC_OR_DEPARTMENT_OTHER): Admission: RE | Payer: Self-pay | Source: Ambulatory Visit

## 2014-09-28 ENCOUNTER — Ambulatory Visit (HOSPITAL_BASED_OUTPATIENT_CLINIC_OR_DEPARTMENT_OTHER): Admission: RE | Admit: 2014-09-28 | Payer: Self-pay | Source: Ambulatory Visit | Admitting: Orthopedic Surgery

## 2014-09-28 SURGERY — OPEN REDUCTION INTERNAL FIXATION (ORIF) HAND
Anesthesia: General | Site: Finger | Laterality: Left

## 2015-04-15 DIAGNOSIS — N411 Chronic prostatitis: Secondary | ICD-10-CM | POA: Insufficient documentation

## 2015-04-15 DIAGNOSIS — N138 Other obstructive and reflux uropathy: Secondary | ICD-10-CM | POA: Insufficient documentation

## 2015-04-15 DIAGNOSIS — N401 Enlarged prostate with lower urinary tract symptoms: Secondary | ICD-10-CM | POA: Insufficient documentation

## 2015-05-25 DIAGNOSIS — M720 Palmar fascial fibromatosis [Dupuytren]: Secondary | ICD-10-CM | POA: Insufficient documentation

## 2016-02-14 ENCOUNTER — Ambulatory Visit (INDEPENDENT_AMBULATORY_CARE_PROVIDER_SITE_OTHER): Payer: BLUE CROSS/BLUE SHIELD

## 2016-02-14 ENCOUNTER — Encounter: Payer: Self-pay | Admitting: Podiatry

## 2016-02-14 ENCOUNTER — Ambulatory Visit (INDEPENDENT_AMBULATORY_CARE_PROVIDER_SITE_OTHER): Payer: BLUE CROSS/BLUE SHIELD | Admitting: Podiatry

## 2016-02-14 VITALS — BP 134/88 | HR 57 | Resp 16

## 2016-02-14 DIAGNOSIS — M7751 Other enthesopathy of right foot: Secondary | ICD-10-CM | POA: Diagnosis not present

## 2016-02-14 DIAGNOSIS — M79671 Pain in right foot: Secondary | ICD-10-CM

## 2016-02-14 DIAGNOSIS — M778 Other enthesopathies, not elsewhere classified: Secondary | ICD-10-CM

## 2016-02-14 DIAGNOSIS — M779 Enthesopathy, unspecified: Principal | ICD-10-CM

## 2016-02-14 NOTE — Progress Notes (Signed)
   Subjective:    Patient ID: Jacob Garrett, male    DOB: 1951/07/21, 65 y.o.   MRN: [id number]  HPI: He presents today with about a 2 month duration of pain beneath the second metatarsophalangeal joint of the right foot. He states that it feels like there is a swollen painful area last step across the foot. Otherwise it really doesn't bother me until I put a lot of pressure on it. He relates that he has a history of Dupuytren's contractures. He denies trauma to the foot.    Review of Systems  All other systems reviewed and are negative.      Objective:   Physical Exam: Vital signs are stable alert and oriented 3. Pulses are strongly palpable. Neurologic sensorium is intact. Deep tendon reflexes are intact bilateral and muscle strength +5 over 5 dorsiflexion plantar flexors and inverters everters all intrinsic musculature is intact. Orthopedic evaluation of his resolved joints distal to the ankle range of motion without crepitation. Beneath the second metatarsal phalangeal joint or just distal to that area is a small firm nodule that appears to be more of a fibroma or bursitis type lesion. Radiographs do not demonstrate any type of significant calcification to the area and no foreign bodies.        Assessment & Plan:  Probable capsulitis bursitis or possibly a fibroma subsecond met right.  Plan: I injected it today with dexamethasone and local anesthetic. Should this alleviate some of his symptoms that we can consider Kenalog injection next visit or go straight for an MRI. That will need to be an MRI with contrast.

## 2016-02-16 ENCOUNTER — Ambulatory Visit: Payer: Self-pay | Admitting: Podiatry

## 2016-03-13 ENCOUNTER — Ambulatory Visit: Payer: Self-pay | Admitting: Podiatry

## 2016-03-15 ENCOUNTER — Ambulatory Visit (INDEPENDENT_AMBULATORY_CARE_PROVIDER_SITE_OTHER): Payer: BLUE CROSS/BLUE SHIELD | Admitting: Podiatry

## 2016-03-15 ENCOUNTER — Encounter: Payer: Self-pay | Admitting: Podiatry

## 2016-03-15 DIAGNOSIS — M722 Plantar fascial fibromatosis: Secondary | ICD-10-CM | POA: Diagnosis not present

## 2016-03-17 NOTE — Progress Notes (Signed)
He presents today for follow-up of his painful lesion subsecond metatarsophalangeal joint of the right foot. He states they're really hasn't changed that much. However he states that it is not quite as painful or anesthetic.  Objective: Vital signs are stable he is alert and oriented 3 soft tissue mass subsecond metatarsophalangeal joint appears to be much smaller than previously noted. It is not nearly as fluctuant but appears to be more firm at this point. It still contains reactive hyperkeratotic tissue overlying it.  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  Plan: I injected the lesion today directly from the plantar surface with Kenalog and local anesthetic. If this does not lessen over the next 4-6 weeks and an MRI will be necessary as we have injected the area twice.

## 2016-03-27 ENCOUNTER — Ambulatory Visit: Payer: BLUE CROSS/BLUE SHIELD | Admitting: Podiatry

## 2016-03-28 ENCOUNTER — Telehealth: Payer: Self-pay | Admitting: *Deleted

## 2016-03-28 NOTE — Telephone Encounter (Signed)
Pt's wife, Vaughan Basta states Steffen is coming to see Dr. Milinda Pointer 03/29/2016 and, Dr. Malvin Johns [facility] would like to discuss pt's current medications and therapy. Dr. Malvin Johns would be available today until 5:00pm, cellphone [phone]. During the phone message there was a great deal of static and interruptions so I called both numbers given by Vaughan Basta and had to get her to confirm Dr. Ulla Potash number so I was certain of the number Dr. Milinda Pointer should call.

## 2016-03-29 ENCOUNTER — Ambulatory Visit (INDEPENDENT_AMBULATORY_CARE_PROVIDER_SITE_OTHER): Payer: BLUE CROSS/BLUE SHIELD

## 2016-03-29 ENCOUNTER — Encounter: Payer: Self-pay | Admitting: Podiatry

## 2016-03-29 ENCOUNTER — Ambulatory Visit (INDEPENDENT_AMBULATORY_CARE_PROVIDER_SITE_OTHER): Payer: BLUE CROSS/BLUE SHIELD | Admitting: Podiatry

## 2016-03-29 VITALS — BP 129/95 | HR 76 | Resp 12

## 2016-03-29 DIAGNOSIS — M722 Plantar fascial fibromatosis: Secondary | ICD-10-CM

## 2016-03-29 DIAGNOSIS — M79672 Pain in left foot: Secondary | ICD-10-CM

## 2016-03-29 NOTE — Progress Notes (Signed)
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 He states they still feel it and I can feel the margins of it when I step on it. It seems to have gone down some but not gone away completely. He states that he was  at the [facility] over the weekend and noticed a new small bumpon the left foot.  Objective: Vital signs are stable alert and oriented 3 pulses are palpable. Neurologic sensorium is intact. Deep tendon reflexes are intact. The lesion subsecond right appears to be going down considerably from previous evaluation. At this point he also has a small 1 cm in diameter lesion at the distalmost aspect of the previous excision site left foot. This lesion as it the medial longitudinal arch just proximal to the sesamoids.  Assessment: Plantar fibromas bilateral.  Plan: I injected the new plantar fibroma left foot. With Kenalog and local anesthetic follow-up with him in a couple of weeks for the right foot.

## 2016-03-29 NOTE — Telephone Encounter (Signed)
Tried the number. No answer no voicemail. I spoke with patient today about his doctors comments.

## 2016-04-12 ENCOUNTER — Ambulatory Visit (INDEPENDENT_AMBULATORY_CARE_PROVIDER_SITE_OTHER): Payer: BLUE CROSS/BLUE SHIELD | Admitting: Podiatry

## 2016-04-12 ENCOUNTER — Encounter: Payer: Self-pay | Admitting: Podiatry

## 2016-04-12 DIAGNOSIS — M722 Plantar fascial fibromatosis: Secondary | ICD-10-CM

## 2016-04-12 NOTE — Progress Notes (Signed)
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 He states that I feel that they're both doing very well at this point. His last injections really seem to help.  Objective: Vital signs are stable he is alert and oriented 3 pulses are palpable. Lesions have subsided by approximately 50%.  Assessment: Well-healing plantar fibromatosis after Kenalog injections.  Plan: Should these recur we will perform an MRI to assess depth for surgical intervention.

## 2016-04-19 ENCOUNTER — Encounter (INDEPENDENT_AMBULATORY_CARE_PROVIDER_SITE_OTHER): Payer: BLUE CROSS/BLUE SHIELD | Admitting: Ophthalmology

## 2016-08-30 DIAGNOSIS — M5414 Radiculopathy, thoracic region: Secondary | ICD-10-CM | POA: Diagnosis not present

## 2016-08-30 DIAGNOSIS — M9901 Segmental and somatic dysfunction of cervical region: Secondary | ICD-10-CM | POA: Diagnosis not present

## 2016-08-30 DIAGNOSIS — M9902 Segmental and somatic dysfunction of thoracic region: Secondary | ICD-10-CM | POA: Diagnosis not present

## 2016-08-30 DIAGNOSIS — M5032 Other cervical disc degeneration, mid-cervical region, unspecified level: Secondary | ICD-10-CM | POA: Diagnosis not present

## 2016-09-03 DIAGNOSIS — M9901 Segmental and somatic dysfunction of cervical region: Secondary | ICD-10-CM | POA: Diagnosis not present

## 2016-09-03 DIAGNOSIS — M5032 Other cervical disc degeneration, mid-cervical region, unspecified level: Secondary | ICD-10-CM | POA: Diagnosis not present

## 2016-09-03 DIAGNOSIS — M5414 Radiculopathy, thoracic region: Secondary | ICD-10-CM | POA: Diagnosis not present

## 2016-09-03 DIAGNOSIS — M9902 Segmental and somatic dysfunction of thoracic region: Secondary | ICD-10-CM | POA: Diagnosis not present

## 2016-09-05 DIAGNOSIS — M9901 Segmental and somatic dysfunction of cervical region: Secondary | ICD-10-CM | POA: Diagnosis not present

## 2016-09-05 DIAGNOSIS — M5032 Other cervical disc degeneration, mid-cervical region, unspecified level: Secondary | ICD-10-CM | POA: Diagnosis not present

## 2016-09-05 DIAGNOSIS — M9902 Segmental and somatic dysfunction of thoracic region: Secondary | ICD-10-CM | POA: Diagnosis not present

## 2016-09-05 DIAGNOSIS — M5414 Radiculopathy, thoracic region: Secondary | ICD-10-CM | POA: Diagnosis not present

## 2016-09-07 DIAGNOSIS — M5414 Radiculopathy, thoracic region: Secondary | ICD-10-CM | POA: Diagnosis not present

## 2016-09-07 DIAGNOSIS — M9901 Segmental and somatic dysfunction of cervical region: Secondary | ICD-10-CM | POA: Diagnosis not present

## 2016-09-07 DIAGNOSIS — M9902 Segmental and somatic dysfunction of thoracic region: Secondary | ICD-10-CM | POA: Diagnosis not present

## 2016-09-07 DIAGNOSIS — M5032 Other cervical disc degeneration, mid-cervical region, unspecified level: Secondary | ICD-10-CM | POA: Diagnosis not present

## 2016-09-17 DIAGNOSIS — M5414 Radiculopathy, thoracic region: Secondary | ICD-10-CM | POA: Diagnosis not present

## 2016-09-17 DIAGNOSIS — M9901 Segmental and somatic dysfunction of cervical region: Secondary | ICD-10-CM | POA: Diagnosis not present

## 2016-09-17 DIAGNOSIS — M5032 Other cervical disc degeneration, mid-cervical region, unspecified level: Secondary | ICD-10-CM | POA: Diagnosis not present

## 2016-09-17 DIAGNOSIS — M9902 Segmental and somatic dysfunction of thoracic region: Secondary | ICD-10-CM | POA: Diagnosis not present

## 2016-10-11 DIAGNOSIS — N390 Urinary tract infection, site not specified: Secondary | ICD-10-CM | POA: Diagnosis not present

## 2016-10-11 DIAGNOSIS — I10 Essential (primary) hypertension: Secondary | ICD-10-CM | POA: Diagnosis not present

## 2016-10-11 DIAGNOSIS — Z125 Encounter for screening for malignant neoplasm of prostate: Secondary | ICD-10-CM | POA: Diagnosis not present

## 2016-10-16 DIAGNOSIS — M720 Palmar fascial fibromatosis [Dupuytren]: Secondary | ICD-10-CM | POA: Diagnosis not present

## 2016-10-17 DIAGNOSIS — R05 Cough: Secondary | ICD-10-CM | POA: Diagnosis not present

## 2016-10-17 DIAGNOSIS — Z8781 Personal history of (healed) traumatic fracture: Secondary | ICD-10-CM | POA: Diagnosis not present

## 2016-10-17 DIAGNOSIS — Z0001 Encounter for general adult medical examination with abnormal findings: Secondary | ICD-10-CM | POA: Diagnosis not present

## 2016-10-17 DIAGNOSIS — Z880 Allergy status to penicillin: Secondary | ICD-10-CM | POA: Diagnosis not present

## 2016-10-17 DIAGNOSIS — Z1212 Encounter for screening for malignant neoplasm of rectum: Secondary | ICD-10-CM | POA: Diagnosis not present

## 2016-10-17 DIAGNOSIS — I10 Essential (primary) hypertension: Secondary | ICD-10-CM | POA: Diagnosis not present

## 2016-10-19 DIAGNOSIS — N138 Other obstructive and reflux uropathy: Secondary | ICD-10-CM | POA: Diagnosis not present

## 2016-10-19 DIAGNOSIS — N411 Chronic prostatitis: Secondary | ICD-10-CM | POA: Diagnosis not present

## 2016-10-19 DIAGNOSIS — N401 Enlarged prostate with lower urinary tract symptoms: Secondary | ICD-10-CM | POA: Diagnosis not present

## 2016-10-19 DIAGNOSIS — R972 Elevated prostate specific antigen [PSA]: Secondary | ICD-10-CM | POA: Diagnosis not present

## 2016-11-07 DIAGNOSIS — H2513 Age-related nuclear cataract, bilateral: Secondary | ICD-10-CM | POA: Diagnosis not present

## 2016-11-07 DIAGNOSIS — H02402 Unspecified ptosis of left eyelid: Secondary | ICD-10-CM | POA: Diagnosis not present

## 2016-11-21 DIAGNOSIS — M720 Palmar fascial fibromatosis [Dupuytren]: Secondary | ICD-10-CM | POA: Diagnosis not present

## 2016-11-26 DIAGNOSIS — R972 Elevated prostate specific antigen [PSA]: Secondary | ICD-10-CM | POA: Diagnosis not present

## 2016-11-26 DIAGNOSIS — I10 Essential (primary) hypertension: Secondary | ICD-10-CM | POA: Diagnosis not present

## 2016-11-27 DIAGNOSIS — M720 Palmar fascial fibromatosis [Dupuytren]: Secondary | ICD-10-CM | POA: Diagnosis not present

## 2016-11-27 DIAGNOSIS — M25649 Stiffness of unspecified hand, not elsewhere classified: Secondary | ICD-10-CM | POA: Diagnosis not present

## 2016-11-29 ENCOUNTER — Encounter: Payer: Self-pay | Admitting: Sports Medicine

## 2016-11-29 ENCOUNTER — Ambulatory Visit (INDEPENDENT_AMBULATORY_CARE_PROVIDER_SITE_OTHER): Payer: Medicare Other | Admitting: Sports Medicine

## 2016-11-29 ENCOUNTER — Ambulatory Visit: Payer: Self-pay

## 2016-11-29 ENCOUNTER — Ambulatory Visit
Admission: RE | Admit: 2016-11-29 | Discharge: 2016-11-29 | Disposition: A | Discharge status: Home / Self Care | Payer: Medicare Other | Source: Ambulatory Visit | Attending: Sports Medicine | Admitting: Sports Medicine

## 2016-11-29 VITALS — BP 135/86 | HR 63 | Ht 70.0 in | Wt 180.0 lb

## 2016-11-29 DIAGNOSIS — M25512 Pain in left shoulder: Secondary | ICD-10-CM | POA: Diagnosis present

## 2016-11-29 NOTE — Assessment & Plan Note (Signed)
Unclear etiology. Does not appear to be coming from his rotator cuff. We'll get x-rays to check for arthritis and had any obvious signs of avascular necrosis. We'll call with results and we will consider further imaging is warranted. Reassured that he can continue to do activities as tolerated.

## 2016-11-29 NOTE — Progress Notes (Signed)
  Jacob Garrett - 66 y.o. male MRN [id number]  Date of birth: 10-Jul-1951  SUBJECTIVE:  Including CC & ROS.  CC: left shoulder pain  Presents in follow-up for left shoulder pain. Has been seen for this in the past and was diagnosed with calcific tendinitis of the rotator cuff. He has subacromial injection which he states did not help. He did describe a steroid flare after the injection. This went away and he still does not have much relief. He was able to tolerate the discomfort for past couple years but over the past couple months she has developed a deep ache in his shoulder. It does not stop him from doing any activities and he has full range of motion. He has complains of a deep ache and it bothers him at night and after he has been using it for long periods. Denies any numbness or tingling down the arm. Has some neck pain and was seen a chiropractor for this. He did get x-rays of it, which he will bring in some point for review.   ROS: No unexpected weight loss, fever, chills, swelling, instability, muscle pain, numbness/tingling, redness, otherwise see HPI   PMHx - Updated and reviewed.  Contributory factors include: Negative PSHx - Updated and reviewed.  Contributory factors include:  Negative FHx - Updated and reviewed.  Contributory factors include:  Negative Social Hx - Updated and reviewed. Contributory factors include: Negative Medications - reviewed   DATA REVIEWED: Previous office visit and ultrasound, which showed calcific tendinitis of the left rotator cuff  PHYSICAL EXAM:  VS: BP:135/86  HR:63bpm  TEMP: ( )  RESP:   HT:5\' 10"  (177.8 cm)   WT:180 lb (81.6 kg)  BMI:25.9 PHYSICAL EXAM: Gen: NAD, alert, cooperative with exam, well-appearing HEENT: clear conjunctiva,  CV:  no edema, capillary refill brisk, normal rate Resp: non-labored Skin: no rashes, normal turgor  Neuro: no gross deficits.  Psych:  alert and oriented  Shoulder: Inspection reveals no abnormalities,  atrophy or asymmetry. Palpation is normal with no tenderness over AC joint or bicipital groove. ROM is full in all planes. Rotator cuff strength normal throughout. No signs of impingement with negative Neer and Hawkin's tests, empty can sign. Speeds and Yergason's tests normal. No labral pathology noted with negative Obrien's, negative clunk and good stability. Normal scapular function observed. No painful arc and no drop arm sign. No apprehension sign   Limited ultrasound of left shoulder:  Biceps tendon appears normal fibrillar pattern without surrounding effusion.  Subscapularis appears normal without obvious tears or abnormalities.  The supraspinatus tendon appears normal with no tears and a good footprint.   The infraspinatus and teres minor tendons appear normal with no tears and a good footprints.  The subdeltoid/subacromial bursa is unremarkable and shows no impingement with dynamic motion.  The [facility] joint appears to have spurring present and decreased joint space, but no effusion is present.  Glenohumeral joint posteriorly with no spurs and no effusion.  Summary: Normal ultrasound of left shoulder x-ray  Performed and interpreted by Melton Krebs, DO   ASSESSMENT & PLAN:   Left shoulder pain Unclear etiology. Does not appear to be coming from his rotator cuff. We'll get x-rays to check for arthritis and had any obvious signs of avascular necrosis. We'll call with results and we will consider further imaging is warranted. Reassured that he can continue to do activities as tolerated.

## 2016-12-07 ENCOUNTER — Telehealth: Payer: Self-pay | Admitting: Student

## 2016-12-07 NOTE — Telephone Encounter (Signed)
Called pt and left VM to call back and discuss x-ray results.   Signed,  Balinda Quails, DO [facility] 9:24 AM

## 2016-12-12 ENCOUNTER — Telehealth: Payer: Self-pay | Admitting: Student

## 2016-12-12 NOTE — Telephone Encounter (Signed)
-----   Message from Carolyne Littles sent at 12/11/2016  3:36 PM EDT ----- Pt returned your call. Please call with xray results

## 2016-12-12 NOTE — Telephone Encounter (Signed)
Called pt and verified name and DOB.  Reviewed x-ray results- calcific tendinitis.  Offered saline injection with cortisone to add volume to help break up calcifications, but he would like to wait.  This is a reasonable options- continue exercises as long as not aggravating, let pain be guide.  If plateauing or worsening- f/u for injection.   Signed,  Balinda Quails, DO [facility] 1:35 PM

## 2016-12-26 DIAGNOSIS — M720 Palmar fascial fibromatosis [Dupuytren]: Secondary | ICD-10-CM | POA: Diagnosis not present

## 2017-01-08 DIAGNOSIS — H02402 Unspecified ptosis of left eyelid: Secondary | ICD-10-CM | POA: Diagnosis not present

## 2017-01-08 DIAGNOSIS — H2513 Age-related nuclear cataract, bilateral: Secondary | ICD-10-CM | POA: Diagnosis not present

## 2017-01-08 DIAGNOSIS — H2511 Age-related nuclear cataract, right eye: Secondary | ICD-10-CM | POA: Diagnosis not present

## 2017-01-17 DIAGNOSIS — Z87891 Personal history of nicotine dependence: Secondary | ICD-10-CM | POA: Diagnosis not present

## 2017-01-17 DIAGNOSIS — I10 Essential (primary) hypertension: Secondary | ICD-10-CM | POA: Diagnosis not present

## 2017-01-17 DIAGNOSIS — H25811 Combined forms of age-related cataract, right eye: Secondary | ICD-10-CM | POA: Diagnosis not present

## 2017-01-25 DIAGNOSIS — R972 Elevated prostate specific antigen [PSA]: Secondary | ICD-10-CM | POA: Diagnosis not present

## 2017-01-25 DIAGNOSIS — N138 Other obstructive and reflux uropathy: Secondary | ICD-10-CM | POA: Diagnosis not present

## 2017-01-25 DIAGNOSIS — N401 Enlarged prostate with lower urinary tract symptoms: Secondary | ICD-10-CM | POA: Diagnosis not present

## 2017-01-31 DIAGNOSIS — H25812 Combined forms of age-related cataract, left eye: Secondary | ICD-10-CM | POA: Diagnosis not present

## 2017-03-22 DIAGNOSIS — M545 Low back pain: Secondary | ICD-10-CM | POA: Diagnosis not present

## 2017-03-22 DIAGNOSIS — M25512 Pain in left shoulder: Secondary | ICD-10-CM | POA: Diagnosis not present

## 2017-03-22 DIAGNOSIS — M549 Dorsalgia, unspecified: Secondary | ICD-10-CM | POA: Diagnosis not present

## 2017-03-27 DIAGNOSIS — M545 Low back pain: Secondary | ICD-10-CM | POA: Diagnosis not present

## 2017-04-01 DIAGNOSIS — M545 Low back pain: Secondary | ICD-10-CM | POA: Diagnosis not present

## 2017-04-04 DIAGNOSIS — M545 Low back pain: Secondary | ICD-10-CM | POA: Diagnosis not present

## 2017-04-08 DIAGNOSIS — M25512 Pain in left shoulder: Secondary | ICD-10-CM | POA: Diagnosis not present

## 2017-04-08 DIAGNOSIS — M545 Low back pain: Secondary | ICD-10-CM | POA: Diagnosis not present

## 2017-04-11 DIAGNOSIS — M25512 Pain in left shoulder: Secondary | ICD-10-CM | POA: Diagnosis not present

## 2017-04-11 DIAGNOSIS — M545 Low back pain: Secondary | ICD-10-CM | POA: Diagnosis not present

## 2017-04-16 DIAGNOSIS — M545 Low back pain: Secondary | ICD-10-CM | POA: Diagnosis not present

## 2017-04-16 DIAGNOSIS — M25512 Pain in left shoulder: Secondary | ICD-10-CM | POA: Diagnosis not present

## 2017-04-18 DIAGNOSIS — M25512 Pain in left shoulder: Secondary | ICD-10-CM | POA: Diagnosis not present

## 2017-04-18 DIAGNOSIS — M545 Low back pain: Secondary | ICD-10-CM | POA: Diagnosis not present

## 2017-04-22 DIAGNOSIS — M545 Low back pain: Secondary | ICD-10-CM | POA: Diagnosis not present

## 2017-04-22 DIAGNOSIS — M25512 Pain in left shoulder: Secondary | ICD-10-CM | POA: Diagnosis not present

## 2017-04-25 DIAGNOSIS — M25512 Pain in left shoulder: Secondary | ICD-10-CM | POA: Diagnosis not present

## 2017-04-25 DIAGNOSIS — M545 Low back pain: Secondary | ICD-10-CM | POA: Diagnosis not present

## 2017-05-16 ENCOUNTER — Ambulatory Visit (INDEPENDENT_AMBULATORY_CARE_PROVIDER_SITE_OTHER): Payer: Medicare Other

## 2017-05-16 ENCOUNTER — Other Ambulatory Visit: Payer: Self-pay | Admitting: Podiatry

## 2017-05-16 ENCOUNTER — Encounter: Payer: Self-pay | Admitting: Podiatry

## 2017-05-16 ENCOUNTER — Ambulatory Visit (INDEPENDENT_AMBULATORY_CARE_PROVIDER_SITE_OTHER): Payer: Medicare Other | Admitting: Podiatry

## 2017-05-16 DIAGNOSIS — M722 Plantar fascial fibromatosis: Secondary | ICD-10-CM

## 2017-05-16 DIAGNOSIS — M79675 Pain in left toe(s): Secondary | ICD-10-CM

## 2017-05-16 NOTE — Progress Notes (Signed)
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 He states that I think that having a fibroma under this toes he points to the hallux left. He states that the one beneath the second metatarsophalangeal joint is somewhat tender but is not horrible. He does state that it seems to affect his ability to have a normal gait.  Objective: Vital signs are stable he is alert and oriented 3 he does have a new plantar fibroma plantar aspect of the proximal phalanx hallux left measuring just greater than 1 cm in diameter. He still retains a small fibroma subsecond metatarsophalangeal joint of the right foot again beneath the second metatarsal and less than a centimeter in diameter.  Assessment: Plantar fibromatosis bilateral foot.  Plan: I injected both of these lesions today with Kenalog and local anesthetic will follow up with him in 3 months for reevaluation and possible further injections.

## 2017-05-17 DIAGNOSIS — H26491 Other secondary cataract, right eye: Secondary | ICD-10-CM | POA: Diagnosis not present

## 2017-06-25 DIAGNOSIS — M720 Palmar fascial fibromatosis [Dupuytren]: Secondary | ICD-10-CM | POA: Diagnosis not present

## 2017-06-28 DIAGNOSIS — H26492 Other secondary cataract, left eye: Secondary | ICD-10-CM | POA: Diagnosis not present

## 2017-09-03 ENCOUNTER — Ambulatory Visit (INDEPENDENT_AMBULATORY_CARE_PROVIDER_SITE_OTHER): Payer: Medicare Other | Admitting: Podiatry

## 2017-09-03 ENCOUNTER — Encounter: Payer: Self-pay | Admitting: Podiatry

## 2017-09-03 DIAGNOSIS — Q828 Other specified congenital malformations of skin: Secondary | ICD-10-CM

## 2017-09-03 DIAGNOSIS — M722 Plantar fascial fibromatosis: Secondary | ICD-10-CM | POA: Diagnosis not present

## 2017-09-04 NOTE — Progress Notes (Signed)
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  Also has painful calluses.  He states that plantar fibromas are doing very well that he can still feel the one beneath the second metatarsal phalangeal joint though it is not as large as it has been in the past.  He states that he feels like he is doing pretty well at this point other than the painful calluses.  Objective: Vital signs are stable he is alert and oriented x3.  Pulses are palpable.  There is no erythema edema cellulitis drainage or odor multiple callosities particularly the first and the fifth metatarsophalangeal joints bilaterally plantar fibromas are almost nonexistent they appear to be doing very well at this point.  The callosities on the lateral aspect of the foot are plantar lateral main that it feels to me that his shoes may be too tight.  Assessment: Plantar calluses plantar fibromatosis pes planus.  Plan: I recommended custom orthotics he declined after talking to [place].  I did debride all of his reactive hyperkeratosis for him today and I will follow-up with him as needed.

## 2017-09-10 DIAGNOSIS — H903 Sensorineural hearing loss, bilateral: Secondary | ICD-10-CM | POA: Diagnosis not present

## 2017-09-17 DIAGNOSIS — M720 Palmar fascial fibromatosis [Dupuytren]: Secondary | ICD-10-CM | POA: Diagnosis not present

## 2017-09-18 DIAGNOSIS — H903 Sensorineural hearing loss, bilateral: Secondary | ICD-10-CM | POA: Diagnosis not present

## 2017-10-16 DIAGNOSIS — I10 Essential (primary) hypertension: Secondary | ICD-10-CM | POA: Diagnosis not present

## 2017-10-16 DIAGNOSIS — Z125 Encounter for screening for malignant neoplasm of prostate: Secondary | ICD-10-CM | POA: Diagnosis not present

## 2017-10-25 DIAGNOSIS — N411 Chronic prostatitis: Secondary | ICD-10-CM | POA: Diagnosis not present

## 2017-10-25 DIAGNOSIS — N401 Enlarged prostate with lower urinary tract symptoms: Secondary | ICD-10-CM | POA: Diagnosis not present

## 2017-10-25 DIAGNOSIS — R972 Elevated prostate specific antigen [PSA]: Secondary | ICD-10-CM | POA: Diagnosis not present

## 2017-11-19 ENCOUNTER — Ambulatory Visit (INDEPENDENT_AMBULATORY_CARE_PROVIDER_SITE_OTHER): Payer: Medicare Other | Admitting: Podiatry

## 2017-11-19 ENCOUNTER — Ambulatory Visit (INDEPENDENT_AMBULATORY_CARE_PROVIDER_SITE_OTHER): Payer: Medicare Other

## 2017-11-19 DIAGNOSIS — M722 Plantar fascial fibromatosis: Secondary | ICD-10-CM | POA: Diagnosis not present

## 2017-11-20 ENCOUNTER — Encounter: Payer: Self-pay | Admitting: Podiatry

## 2017-11-20 NOTE — Progress Notes (Signed)
He presents today for chief complaint of painful nodules to the forefoot right.  He states that these have really just become painful over the last few days he has a history of plantar fibromatosis as well as Dupuytren's contractures.  Objective: Vital signs are stable alert and oriented x3.  Pulses are palpable.  Neurologic sensorium is intact.  He has a firm nodule at the base of the second toe overlying the metatarsal phalangeal joint it is nonpulsatile it is not bony.  He also has another nonpulsatile firm nodule overlying the plantar medial aspect of the first metatarsal phalangeal joint just proximal to the tibial sesamoid.  Both of these measure approximately a centimeter in diameter.  Assessment: Plantar fibroma x2 right foot.  Plan: After sterile Betadine skin prep we injected 20 mg of Kenalog 5 mg of Marcaine into the tumor itself x2.  He tolerated the procedure well without complications.  I will follow-up with him in a couple of months if these do not resolve.

## 2018-01-23 ENCOUNTER — Encounter: Payer: Self-pay | Admitting: Podiatry

## 2018-01-23 ENCOUNTER — Ambulatory Visit (INDEPENDENT_AMBULATORY_CARE_PROVIDER_SITE_OTHER): Payer: Medicare Other | Admitting: Podiatry

## 2018-01-23 DIAGNOSIS — M722 Plantar fascial fibromatosis: Secondary | ICD-10-CM

## 2018-01-23 NOTE — Progress Notes (Signed)
He presents today with continued pain to the plantar fibromas particularly subsecond metatarsal phalangeal joint and second toe right foot.  He states this seems to be the worst.  He states that he really just has not improved even with the multiple injections.  Objective: Vital signs stable alert oriented x3 painful nodule beneath the second metatarsal phalangeal joint and second base of the toe.  There is some skin atrophy that is associated more than likely with the steroid use however I feel that this has not receded at all.  Assessment: Plantar fibroma right foot.  Plan: At this point am requesting MRI for surgical consideration to outline the fibroma evaluate the depth of the fibroma and the structures for which surrounds.

## 2018-01-24 ENCOUNTER — Telehealth: Payer: Self-pay | Admitting: *Deleted

## 2018-01-24 DIAGNOSIS — M722 Plantar fascial fibromatosis: Secondary | ICD-10-CM

## 2018-01-24 DIAGNOSIS — Z01812 Encounter for preprocedural laboratory examination: Secondary | ICD-10-CM

## 2018-01-24 NOTE — Telephone Encounter (Signed)
Orders to [facility] for pre-cert and faxed to [facility]. 

## 2018-01-24 NOTE — Telephone Encounter (Signed)
-----   Message from Rip Harbour, [facility] sent at 01/23/2018  2:23 PM EDT ----- Regarding: MRI  MRI forefoot right - evaluate plantar fibroma near 2nd MPJ/toe right foot - surgical consideration

## 2018-01-28 ENCOUNTER — Ambulatory Visit
Admission: RE | Admit: 2018-01-28 | Discharge: 2018-01-28 | Disposition: A | Discharge status: Home / Self Care | Payer: Medicare Other | Source: Ambulatory Visit | Attending: Podiatry | Admitting: Podiatry

## 2018-01-28 DIAGNOSIS — M722 Plantar fascial fibromatosis: Secondary | ICD-10-CM

## 2018-01-28 DIAGNOSIS — R2241 Localized swelling, mass and lump, right lower limb: Secondary | ICD-10-CM | POA: Diagnosis not present

## 2018-01-28 MED ORDER — GADOBENATE DIMEGLUMINE 529 MG/ML IV SOLN
16.0000 mL | Freq: Once | INTRAVENOUS | Status: AC | PRN
  Administered 2018-01-28: 16 mL via INTRAVENOUS

## 2018-01-29 DIAGNOSIS — R7303 Prediabetes: Secondary | ICD-10-CM | POA: Diagnosis not present

## 2018-01-29 DIAGNOSIS — I10 Essential (primary) hypertension: Secondary | ICD-10-CM | POA: Diagnosis not present

## 2018-01-29 DIAGNOSIS — R109 Unspecified abdominal pain: Secondary | ICD-10-CM | POA: Diagnosis not present

## 2018-01-29 DIAGNOSIS — E781 Pure hyperglyceridemia: Secondary | ICD-10-CM | POA: Diagnosis not present

## 2018-01-29 DIAGNOSIS — M720 Palmar fascial fibromatosis [Dupuytren]: Secondary | ICD-10-CM | POA: Diagnosis not present

## 2018-01-29 DIAGNOSIS — M7532 Calcific tendinitis of left shoulder: Secondary | ICD-10-CM | POA: Diagnosis not present

## 2018-01-29 DIAGNOSIS — L720 Epidermal cyst: Secondary | ICD-10-CM | POA: Diagnosis not present

## 2018-01-29 DIAGNOSIS — R1900 Intra-abdominal and pelvic swelling, mass and lump, unspecified site: Secondary | ICD-10-CM | POA: Diagnosis not present

## 2018-01-30 ENCOUNTER — Telehealth: Payer: Self-pay | Admitting: *Deleted

## 2018-01-30 DIAGNOSIS — R1900 Intra-abdominal and pelvic swelling, mass and lump, unspecified site: Secondary | ICD-10-CM | POA: Diagnosis not present

## 2018-01-30 DIAGNOSIS — R109 Unspecified abdominal pain: Secondary | ICD-10-CM | POA: Diagnosis not present

## 2018-01-30 DIAGNOSIS — K824 Cholesterolosis of gallbladder: Secondary | ICD-10-CM | POA: Diagnosis not present

## 2018-01-30 NOTE — Telephone Encounter (Signed)
Left message requesting a call back to discuss results. 

## 2018-01-30 NOTE — Telephone Encounter (Signed)
-----   Message from Garrel Ridgel, [place] sent at 01/29/2018 11:55 AM EDT ----- Yes it is plantar fibromatosis and if he would like to consider surgery please have him in for a consult.

## 2018-01-31 ENCOUNTER — Telehealth: Payer: Self-pay | Admitting: Podiatry

## 2018-01-31 NOTE — Telephone Encounter (Signed)
Good morning Valery. This is Mattheus Rauls trying to get back in touch with you on test results. I will wait for you to call me again at [phone]. Thank you. I will be waiting on your call.

## 2018-01-31 NOTE — Telephone Encounter (Signed)
Spoke with patient about MRI results, informing him that it did show a plantar fibroma. I told him that if he wanted to come in to discuss surgical options with Dr Milinda Pointer he could call and make an appt for a consultation

## 2018-01-31 NOTE — Telephone Encounter (Signed)
Good morning Jacob Garrett, this is [employer]. I had a message from you late yesterday afternoon to give you a call regarding test results. You can try me, if I don't have my phone it will go to voicemail and I will try calling you back. Thanks ma'am.

## 2018-02-04 ENCOUNTER — Ambulatory Visit (INDEPENDENT_AMBULATORY_CARE_PROVIDER_SITE_OTHER): Payer: Medicare Other | Admitting: Podiatry

## 2018-02-04 ENCOUNTER — Encounter: Payer: Self-pay | Admitting: Podiatry

## 2018-02-04 DIAGNOSIS — M722 Plantar fascial fibromatosis: Secondary | ICD-10-CM

## 2018-02-04 NOTE — Patient Instructions (Signed)
Pre-Operative Instructions  Congratulations, you have decided to take an important step towards improving your quality of life.  You can be assured that the doctors and staff at [facility] will be with you every step of the way.  Here are some important things you should know:  1. Plan to be at the surgery center/hospital at least 1 (one) hour prior to your scheduled time, unless otherwise directed by the surgical center/hospital staff.  You must have a responsible adult accompany you, remain during the surgery and drive you home.  Make sure you have directions to the surgical center/hospital to ensure you arrive on time. 2. If you are having surgery at [facility] or [facility] hospitals, you will need a copy of your medical history and physical form from your family physician within one month prior to the date of surgery. We will give you a form for your primary physician to complete.  3. We make every effort to accommodate the date you request for surgery.  However, there are times where surgery dates or times have to be moved.  We will contact you as soon as possible if a change in schedule is required.   4. No aspirin/ibuprofen for one week before surgery.  If you are on aspirin, any non-steroidal anti-inflammatory medications (Mobic, Aleve, Ibuprofen) should not be taken seven (7) days prior to your surgery.  You make take Tylenol for pain prior to surgery.  5. Medications - If you are taking daily heart and blood pressure medications, seizure, reflux, allergy, asthma, anxiety, pain or diabetes medications, make sure you notify the surgery center/hospital before the day of surgery so they can tell you which medications you should take or avoid the day of surgery. 6. No food or drink after midnight the night before surgery unless directed otherwise by surgical center/hospital staff. 7. No alcoholic beverages 24-hours prior to surgery.  No smoking 24-hours prior or 24-hours after  surgery. 8. Wear loose pants or shorts. They should be loose enough to fit over bandages, boots, and casts. 9. Don't wear slip-on shoes. Sneakers are preferred. 10. Bring your boot with you to the surgery center/hospital.  Also bring crutches or a walker if your physician has prescribed it for you.  If you do not have this equipment, it will be provided for you after surgery. 11. If you have not been contacted by the surgery center/hospital by the day before your surgery, call to confirm the date and time of your surgery. 12. Leave-time from work may vary depending on the type of surgery you have.  Appropriate arrangements should be made prior to surgery with your employer. 13. Prescriptions will be provided immediately following surgery by your doctor.  Fill these as soon as possible after surgery and take the medication as directed. Pain medications will not be refilled on weekends and must be approved by the doctor. 14. Remove nail polish on the operative foot and avoid getting pedicures prior to surgery. 15. Wash the night before surgery.  The night before surgery wash the foot and leg well with water and the antibacterial soap provided. Be sure to pay special attention to beneath the toenails and in between the toes.  Wash for at least three (3) minutes. Rinse thoroughly with water and dry well with a towel.  Perform this wash unless told not to do so by your physician.  Enclosed: 1 Ice pack (please put in freezer the night before surgery)   1 Hibiclens skin cleaner     Pre-op instructions  If you have any questions regarding the instructions, please do not hesitate to call our office.  [place]: [address], [place], [place] [address] -- [phone]  [place]: [address] [place], [place] [address] -- [phone]  [place]: [address]  [place], [place] [address] -- [phone]  [place]: [address], [address], [place], [place] [address] -- [phone]  Website:  [email or web address] 

## 2018-02-04 NOTE — Progress Notes (Signed)
He presents today for a surgical consult regarding painful lesion plantar aspect of the right foot at the second metatarsal phalangeal joint.  Objective: Vital signs are stable he is alert and oriented x3.  Pulses are palpable.  Plantar fibroma still present at the base of the second toe at the metatarsal phalangeal joint.  MRI does relate plantar fibroma.  Assessment: Plantar fibromatosis second metatarsophalangeal joint right foot.  Plan: Discussed etiology pathology and surgical therapies at this point time expressed to him that this needs to be taken care of he understands is amenable to it we consented him today for excision plantar fibroma second metatarsophalangeal joint of the right foot.  Discussed possible postop complications which may include but not limited to postop pain bleeding swelling infection recurrence need further surgery overcorrection under correction failure to alleviate his symptoms.  He understands this is amenable to it signed all 3 pages a consent form dispensed a Cam walker he understands these will be able to walk on this for the next 21 days after surgery will discuss the use of crutches and/or knee scooter.

## 2018-02-06 ENCOUNTER — Telehealth: Payer: Self-pay | Admitting: *Deleted

## 2018-02-06 DIAGNOSIS — R0781 Pleurodynia: Secondary | ICD-10-CM | POA: Diagnosis not present

## 2018-02-06 DIAGNOSIS — K824 Cholesterolosis of gallbladder: Secondary | ICD-10-CM | POA: Diagnosis not present

## 2018-02-06 DIAGNOSIS — R109 Unspecified abdominal pain: Secondary | ICD-10-CM | POA: Diagnosis not present

## 2018-02-06 DIAGNOSIS — R0789 Other chest pain: Secondary | ICD-10-CM | POA: Diagnosis not present

## 2018-02-06 NOTE — Telephone Encounter (Signed)
"  I was in to see Dr. Milinda Pointer earlier this week.  He wants to schedule surgery.  I left a message yesterday morning to that affect.  I'm leaving another message this morning.  I'm ready to schedule that, you're the name that they gave me.  If you can call when possible so we can set that up.  Thank you for your help."

## 2018-02-06 NOTE — Telephone Encounter (Signed)
"  I was in to see Dr. Milinda Pointer yesterday.  He's recommending surgery on my right foot.  I was calling to coordinate scheduling that.  I was told you were booking around July maybe early August.  I'd like to go ahead and get surgery scheduled for my right foot."

## 2018-02-06 NOTE — Telephone Encounter (Signed)
I attempted to call the patient.  I left him a message to call me tomorrow.

## 2018-02-07 NOTE — Telephone Encounter (Signed)
I am returning your call.  You want to schedule your surgery with Dr. Milinda Pointer.  "Yes, I do.  Thanks for calling me back.  What's his next available date?"  He can do it on April 04, 2018.  "I'd like to push it back a little further towards the end of September.  I have too much going on this summer.  Can he do it on September 27?"  Yes, he can.  I will get it scheduled.  You will hear from someone from the surgical center a day or two prior to your surgery date.  They will give you your arrival time.  You can go ahead and register with the surgical center on-line, instructions are in the brochure that was given to you.  "I'll take care of it.  Thank you so much for your help."

## 2018-03-25 DIAGNOSIS — M720 Palmar fascial fibromatosis [Dupuytren]: Secondary | ICD-10-CM | POA: Diagnosis not present

## 2018-04-09 ENCOUNTER — Encounter: Payer: Self-pay | Admitting: Internal Medicine

## 2018-04-09 ENCOUNTER — Encounter (INDEPENDENT_AMBULATORY_CARE_PROVIDER_SITE_OTHER): Payer: Self-pay

## 2018-04-09 ENCOUNTER — Ambulatory Visit (INDEPENDENT_AMBULATORY_CARE_PROVIDER_SITE_OTHER): Payer: Medicare Other | Admitting: Internal Medicine

## 2018-04-09 VITALS — BP 102/60 | HR 60 | Ht 70.0 in | Wt 177.4 lb

## 2018-04-09 DIAGNOSIS — L309 Dermatitis, unspecified: Secondary | ICD-10-CM | POA: Diagnosis not present

## 2018-04-09 DIAGNOSIS — Z86010 Personal history of colonic polyps: Secondary | ICD-10-CM

## 2018-04-09 DIAGNOSIS — R0789 Other chest pain: Secondary | ICD-10-CM | POA: Diagnosis not present

## 2018-04-09 MED ORDER — DICLOFENAC SODIUM 1 % TD GEL: 2.0000 g | Freq: Four times a day (QID) | TRANSDERMAL | 2 refills | Status: DC

## 2018-04-09 MED ORDER — NYSTATIN-TRIAMCINOLONE 100000-0.1 UNIT/GM-% EX OINT: 1.0000 "application " | TOPICAL_OINTMENT | Freq: Two times a day (BID) | CUTANEOUS | 0 refills | Status: DC

## 2018-04-09 NOTE — Progress Notes (Signed)
Jacob Garrett 67 y.o. 1950-12-06 [id number] Referred by Deland Pretty, MD Assessment & Plan:   Encounter Diagnoses  Name Primary?  Andrena Mews Yes  . Perianal dermatitis   . Hx of colonic polyps    He will try diclofenac gel for the xiphoidagia. He was reassured and accepting that nothing serious going on. He will discuss changing exercises with PT as I think his low back exercises may be related to this symptom.  Nystatin/clotrimazole topical for perianal dermatitis and if too costly OTC clotrimazole + hydrocortisone Zeasorb AF also  Will obtain prior colonoscopy and pathology re[orts and place appropriate recall for colonoscopy  I appreciate the opportunity to care for him. NW:GNFAO, [place] Jew, MD  Subjective:   Chief Complaint:epigastric pain and anal itching  HPI Several month hx tenderness and pain at xiphoid. Thinks xiphoid is protruding more. In setting of low back exercises that include tension of abdominal wall and core muscles. Has seen Dr. Shelia Media and was told not a sig problem but wants to be sure there is no other problem. No association with eating or defecation. He says he is "a lumpy kind of guy" and whenever a lump shows up it becomes a problem and needs to be removed e.g. Dupuytren's contractures.   Has had anal itching x 6 mos and denies fecal seepage, change in bowel habits, rectal bleeding, or change in activities. 6 caffeine drinks daily   Some R groin/LQ pain at site opf a hernia repair  Hx colon polyps - has been seen and treated by Dr. Earlean Shawl. Last colonoscopy 2014 and polyps removed he thinks  All GI ROS o/w negative Allergies  Allergen Reactions  . Penicillins     REACTION: Childhood reaction unkown   Current Meds  Medication Sig  . acetaminophen (TYLENOL) 325 MG tablet Take 325 mg by mouth every 6 (six) hours as needed.  Marland Kitchen GLUCOSAMINE-CHONDROITIN PO Take 1 tablet by mouth daily.  . valsartan-hydrochlorothiazide (DIOVAN-HCT) 320-25 MG tablet  Take by mouth.   Past Medical History:  Diagnosis Date  . Anxiety   . Calcific tendinitis of left shoulder   . Cluster headache 1990  . Colon polyps   . Dupuytren's contracture of both hands   . Elevated PSA   . Hypertriglyceridemia   . Inclusion cyst   . Inguinal hernia   . Pre-diabetes   . Thoracic nerve root impingement   . Thoracic spondylosis    Past Surgical History:  Procedure Laterality Date  . benign nodule removed from foot tendon    . CATARACT EXTRACTION, BILATERAL    . COLONOSCOPY W/ POLYPECTOMY     multiple  . Dupuytrens surgeries    . 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   Review of Systems As per HPI Sleep disturbed by nocturia, low back pain, cough All other ROS neg  Objective:   Physical Exam BP 102/60   Pulse 60   Ht 5\' 10"  (1.778 m)   Wt 177 lb 6.4 oz (80.5 kg)   BMI 25.45 kg/m   @BP  102/60   Pulse 60   Ht 5\' 10"  (1.778 m)   Wt 177 lb 6.4 oz (80.5  kg)   BMI 25.45 kg/m @  General:  Well-developed, well-nourished and in no acute distress Eyes:  anicteric. ENT:   Mouth and posterior pharynx free of lesions.  Neck:   supple w/o thyromegaly or mass.  Lungs: Clear to auscultation bilaterally. Chest wall: Xiphoid tender and slighty prominent other chest wall normal and non-tender Heart:   S1S2, no rubs, murmurs, gallops. Abdomen:  soft, non-tender, no hepatosplenomegaly, hernia, or mass and BS+.  Rectal:  Mild-mod perianal dermatitis  NL DRE  Lymph:  no cervical or supraclavicular adenopathy. Extremities:   no edema, cyanosis or clubbing Skin   no rash.surgical scars R groin, hands Neuro:  A&O x 3.  Psych:  appropriate mood and   Affect.   Data Reviewed:  Requested GI records from [facility]

## 2018-04-09 NOTE — Patient Instructions (Signed)
We have sent the following medications to your pharmacy for you to pick up at your convenience: voltaren gel and mycolog , if these are too expensive use over the counter anti-fungal cream and 1% hydrocortisone cream for several weeks.  After using this for several weeks try over the counter zeasorb twice daily.   Talk to your physical therapy people about your exercise regimen.    I appreciate the opportunity to care for you. Silvano Rusk, MD, [facility]

## 2018-04-12 ENCOUNTER — Encounter: Payer: Self-pay | Admitting: Internal Medicine

## 2018-04-15 ENCOUNTER — Ambulatory Visit (INDEPENDENT_AMBULATORY_CARE_PROVIDER_SITE_OTHER): Payer: Medicare Other | Admitting: Podiatry

## 2018-04-15 ENCOUNTER — Encounter: Payer: Self-pay | Admitting: Podiatry

## 2018-04-15 DIAGNOSIS — M722 Plantar fascial fibromatosis: Secondary | ICD-10-CM | POA: Diagnosis not present

## 2018-04-15 NOTE — Progress Notes (Signed)
He presents today for follow-up of his plantar fibromas.  He is scheduled for surgery to the plantar fibroma of the second metatarsal phalangeal joint right.  He states that it has decided to shrink.  My pain is gone from 7 to a 2 or 3 level.  He states that he is now having pain along the medial aspect of the left foot.  Objective: Vital signs are stable he is alert and oriented x3.  He has pain on palpation of the plantar fibroma measured greater than a centimeter medial aspect of the first metatarsal and plantar medial aspect just proximal to the tibial sesamoid.  These are exquisitely tender.  Palpation of the second metatarsal phalangeal joint has decreased in size and is no longer tender.  Assessment: Plantar fibromatosis resolving right second metatarsal phalangeal joint enlarging left forefoot.  Plan: After sterile Betadine skin prep I injected 20 mg of Kenalog 5 mg Marcaine without between the 2 lesions on the left foot.  I did not inject into the right foot.  He will follow-up with me in late September for surgery unless he wants to cancel it.

## 2018-05-13 ENCOUNTER — Telehealth: Payer: Self-pay | Admitting: *Deleted

## 2018-05-13 NOTE — Telephone Encounter (Signed)
Patients postop appointments have been canceled. °

## 2018-05-13 NOTE — Telephone Encounter (Signed)
"  I'm a patient of Dr. Milinda Pointer.  I'm scheduled for surgery on September 27.  When I saw Dr. Milinda Pointer last my foot had improved. He said for me to keep an eye on it and if it improved to let him know and we can cancel my surgery.  My foot has gotten better.  So, I'm calling to ask that you cancel my surgery."  I'll cancel it and I'll let Dr. Milinda Pointer know.  "Thank you so much."  I canceled the surgery in [facility].

## 2018-05-13 NOTE — Telephone Encounter (Signed)
Cancel his sx

## 2018-05-14 DIAGNOSIS — M720 Palmar fascial fibromatosis [Dupuytren]: Secondary | ICD-10-CM | POA: Diagnosis not present

## 2018-05-20 DIAGNOSIS — M720 Palmar fascial fibromatosis [Dupuytren]: Secondary | ICD-10-CM | POA: Diagnosis not present

## 2018-05-29 ENCOUNTER — Other Ambulatory Visit: Payer: Medicare Other

## 2018-06-16 DIAGNOSIS — R05 Cough: Secondary | ICD-10-CM | POA: Diagnosis not present

## 2018-06-16 DIAGNOSIS — R0789 Other chest pain: Secondary | ICD-10-CM | POA: Diagnosis not present

## 2018-06-16 DIAGNOSIS — I10 Essential (primary) hypertension: Secondary | ICD-10-CM | POA: Diagnosis not present

## 2018-07-28 DIAGNOSIS — Z961 Presence of intraocular lens: Secondary | ICD-10-CM | POA: Diagnosis not present

## 2018-07-28 DIAGNOSIS — H02203 Unspecified lagophthalmos right eye, unspecified eyelid: Secondary | ICD-10-CM | POA: Diagnosis not present

## 2018-08-05 ENCOUNTER — Ambulatory Visit (INDEPENDENT_AMBULATORY_CARE_PROVIDER_SITE_OTHER): Payer: Medicare Other | Admitting: Podiatry

## 2018-08-05 ENCOUNTER — Encounter: Payer: Self-pay | Admitting: Podiatry

## 2018-08-05 ENCOUNTER — Other Ambulatory Visit: Payer: Self-pay | Admitting: Podiatry

## 2018-08-05 ENCOUNTER — Ambulatory Visit (INDEPENDENT_AMBULATORY_CARE_PROVIDER_SITE_OTHER): Payer: Medicare Other

## 2018-08-05 DIAGNOSIS — M722 Plantar fascial fibromatosis: Secondary | ICD-10-CM

## 2018-08-05 DIAGNOSIS — I10 Essential (primary) hypertension: Secondary | ICD-10-CM | POA: Insufficient documentation

## 2018-08-05 DIAGNOSIS — D219 Benign neoplasm of connective and other soft tissue, unspecified: Secondary | ICD-10-CM

## 2018-08-05 DIAGNOSIS — M7752 Other enthesopathy of left foot: Secondary | ICD-10-CM

## 2018-08-05 NOTE — Progress Notes (Signed)
He presents today for follow-up of his plantar fibromas to his right foot.  He states that the right foot is doing great however he feels that he may have a new fibroma at the base of the hallux left that passes the crease.  He denies any trauma.  Objective: Vital signs are stable he is alert and oriented x3 right foot appears to be much decrease in plantar fibromas hardly anything visible whatsoever.  Left foot demonstrates significant decrease in the size of his plantar fibroma along the medial aspect of the foot he does have a small new lesion measuring about a centimeter in diameter it does cross the sulcus lateral aspect of the hallux and does pull on the flexor hallucis longus.  Assessment: Plantar fibroma resolving on the right foot plantar fibroma resolving medial band left foot new plantar fibroma hallux plantar aspect base of the toe.  Plan: Injected the area today with 20 mg Kenalog 5 mg Marcaine directly into the fibroma itself tolerated procedure well without complications we will follow-up with him in 6 weeks if necessary.

## 2018-10-20 DIAGNOSIS — I10 Essential (primary) hypertension: Secondary | ICD-10-CM | POA: Diagnosis not present

## 2018-10-20 DIAGNOSIS — Z1159 Encounter for screening for other viral diseases: Secondary | ICD-10-CM | POA: Diagnosis not present

## 2018-10-20 DIAGNOSIS — Z0000 Encounter for general adult medical examination without abnormal findings: Secondary | ICD-10-CM | POA: Diagnosis not present

## 2018-10-20 DIAGNOSIS — Z125 Encounter for screening for malignant neoplasm of prostate: Secondary | ICD-10-CM | POA: Diagnosis not present

## 2018-10-20 DIAGNOSIS — E781 Pure hyperglyceridemia: Secondary | ICD-10-CM | POA: Diagnosis not present

## 2018-10-24 DIAGNOSIS — R7303 Prediabetes: Secondary | ICD-10-CM | POA: Diagnosis not present

## 2018-10-24 DIAGNOSIS — G250 Essential tremor: Secondary | ICD-10-CM | POA: Diagnosis not present

## 2018-10-24 DIAGNOSIS — M722 Plantar fascial fibromatosis: Secondary | ICD-10-CM | POA: Diagnosis not present

## 2018-10-24 DIAGNOSIS — I10 Essential (primary) hypertension: Secondary | ICD-10-CM | POA: Diagnosis not present

## 2018-10-24 DIAGNOSIS — R972 Elevated prostate specific antigen [PSA]: Secondary | ICD-10-CM | POA: Diagnosis not present

## 2018-10-24 DIAGNOSIS — E781 Pure hyperglyceridemia: Secondary | ICD-10-CM | POA: Diagnosis not present

## 2018-10-24 DIAGNOSIS — R05 Cough: Secondary | ICD-10-CM | POA: Diagnosis not present

## 2018-10-24 DIAGNOSIS — R351 Nocturia: Secondary | ICD-10-CM | POA: Diagnosis not present

## 2018-10-27 DIAGNOSIS — N401 Enlarged prostate with lower urinary tract symptoms: Secondary | ICD-10-CM | POA: Diagnosis not present

## 2018-10-27 DIAGNOSIS — R3912 Poor urinary stream: Secondary | ICD-10-CM | POA: Diagnosis not present

## 2018-10-27 DIAGNOSIS — R972 Elevated prostate specific antigen [PSA]: Secondary | ICD-10-CM | POA: Diagnosis not present

## 2018-10-27 DIAGNOSIS — R3915 Urgency of urination: Secondary | ICD-10-CM | POA: Diagnosis not present

## 2018-10-27 DIAGNOSIS — N411 Chronic prostatitis: Secondary | ICD-10-CM | POA: Diagnosis not present

## 2018-11-14 DIAGNOSIS — I10 Essential (primary) hypertension: Secondary | ICD-10-CM | POA: Diagnosis not present

## 2018-11-20 ENCOUNTER — Encounter: Payer: Self-pay | Admitting: Psychology

## 2018-12-02 DIAGNOSIS — I10 Essential (primary) hypertension: Secondary | ICD-10-CM | POA: Diagnosis not present

## 2018-12-15 DIAGNOSIS — I10 Essential (primary) hypertension: Secondary | ICD-10-CM | POA: Diagnosis not present

## 2019-01-01 ENCOUNTER — Other Ambulatory Visit: Payer: Self-pay

## 2019-01-01 ENCOUNTER — Encounter: Payer: Self-pay | Admitting: Podiatry

## 2019-01-01 ENCOUNTER — Ambulatory Visit (INDEPENDENT_AMBULATORY_CARE_PROVIDER_SITE_OTHER): Payer: Medicare Other | Admitting: Podiatry

## 2019-01-01 VITALS — Temp 97.5°F

## 2019-01-01 DIAGNOSIS — M722 Plantar fascial fibromatosis: Secondary | ICD-10-CM | POA: Diagnosis not present

## 2019-01-01 MED ORDER — NONFORMULARY OR COMPOUNDED ITEM: 3 refills | Status: DC

## 2019-01-03 NOTE — Progress Notes (Signed)
He presents today for follow-up of his plantar fibromas of his right foot x2 and left foot x1 these areas are really starting to bother me again but I am not sure that I want another set of injections at this point.  Objective: Vital signs are stable he is alert and oriented x3 pulses are palpable.  He has a nodular another nodule forming just proximal to the second metatarsal phalangeal joint plantarly.  It very well may be just a extension of the one lesion that has already been there for quite some time at the base of the proximal phalanx of that second toe.  The left foot demonstrates a plantar fibroma along the distal aspect of the medial portion of the plantar fascia measures less than a centimeter in diameter.  Assessment: Plantar fibromatosis.  Plan: At this point wrote a prescription for a compounded cream with of verapamil to help lessen the thickness of the tumors themselves.  I will follow-up with him in about 3 months

## 2019-01-06 ENCOUNTER — Ambulatory Visit: Payer: Medicare Other | Admitting: Psychology

## 2019-01-21 DIAGNOSIS — I10 Essential (primary) hypertension: Secondary | ICD-10-CM | POA: Diagnosis not present

## 2019-01-21 DIAGNOSIS — E8809 Other disorders of plasma-protein metabolism, not elsewhere classified: Secondary | ICD-10-CM | POA: Diagnosis not present

## 2019-01-21 DIAGNOSIS — R5383 Other fatigue: Secondary | ICD-10-CM | POA: Diagnosis not present

## 2019-01-21 DIAGNOSIS — I959 Hypotension, unspecified: Secondary | ICD-10-CM | POA: Diagnosis not present

## 2019-01-21 DIAGNOSIS — R0789 Other chest pain: Secondary | ICD-10-CM | POA: Diagnosis not present

## 2019-01-21 DIAGNOSIS — R0600 Dyspnea, unspecified: Secondary | ICD-10-CM | POA: Diagnosis not present

## 2019-01-21 DIAGNOSIS — R739 Hyperglycemia, unspecified: Secondary | ICD-10-CM | POA: Diagnosis not present

## 2019-01-26 ENCOUNTER — Other Ambulatory Visit: Payer: Self-pay

## 2019-01-26 ENCOUNTER — Ambulatory Visit (INDEPENDENT_AMBULATORY_CARE_PROVIDER_SITE_OTHER): Payer: Medicare Other | Admitting: Cardiology

## 2019-01-26 ENCOUNTER — Encounter: Payer: Self-pay | Admitting: Cardiology

## 2019-01-26 VITALS — BP 110/69 | HR 60 | Ht 70.0 in | Wt 178.0 lb

## 2019-01-26 DIAGNOSIS — R072 Precordial pain: Secondary | ICD-10-CM

## 2019-01-26 DIAGNOSIS — Z87891 Personal history of nicotine dependence: Secondary | ICD-10-CM

## 2019-01-26 DIAGNOSIS — R55 Syncope and collapse: Secondary | ICD-10-CM | POA: Diagnosis not present

## 2019-01-26 DIAGNOSIS — R079 Chest pain, unspecified: Secondary | ICD-10-CM | POA: Diagnosis not present

## 2019-01-26 NOTE — Progress Notes (Signed)
Patient referred by Deland Pretty, MD for chest tightness  Subjective:   Jacob Garrett, male    DOB: [birth year], 68 y.o.   MRN: [id number]   Chief Complaint  Patient presents with  . Shortness of Breath  . Chest Pain    HPI  68 y.o. Caucasian male with chest pain.  Patient is a former smoker, has controlled hypertension.  He lives with his wife, stays busy with yard/from work.  Few days ago, patient experienced chest tightness during usual level of yardwork activity, associated with diaphoresis and lightheadedness with near syncope.  His blood pressure was noted to be in 70s over 50s.  Symptoms were self resolving after rest.  At baseline, patient does have exertional dyspnea, but has never experience chest tightness or lightheadedness with usual activity of yard/farm work.  Patient has curtailed his physical activity since then and has not had any symptoms.  Past Medical History:  Diagnosis Date  . Anxiety   . Calcific tendinitis of left shoulder   . Cluster headache 1990  . Colon polyps   . Dupuytren's contracture of both hands   . Elevated PSA   . Hypertriglyceridemia   . Inclusion cyst   . Inguinal hernia   . Pre-diabetes   . Thoracic nerve root impingement   . Thoracic spondylosis      Past Surgical History:  Procedure Laterality Date  . benign nodule removed from foot tendon    . CATARACT EXTRACTION, BILATERAL    . COLONOSCOPY W/ POLYPECTOMY     multiple  . Dupuytrens surgeries    . HERNIA REPAIR       Social History   Socioeconomic History  . Marital status: Married    Spouse name: Not on file  . Number of children: Not on file  . Years of education: Not on file  . Highest education level: Not on file  Occupational History  . Occupation: retired  Scientific laboratory technician  . Financial resource strain: Not on file  . Food insecurity:    Worry: Not on file    Inability: Not on file  . Transportation needs:    Medical: Not on file    Non-medical: Not on file   Tobacco Use  . Smoking status: Former Smoker    Last attempt to quit: 05/26/1999    Years since quitting: 19.6  . Smokeless tobacco: Never Used  Substance and Sexual Activity  . Alcohol use: Yes    Comment: 1 glass wine every 3 months  . Drug use: Yes    Types: Marijuana  . Sexual activity: Yes  Lifestyle  . Physical activity:    Days per week: Not on file    Minutes per session: Not on file  . Stress: Not on file  Relationships  . Social connections:    Talks on phone: Not on file    Gets together: Not on file    Attends religious service: Not on file    Active member of club or organization: Not on file    Attends meetings of clubs or organizations: Not on file    Relationship status: Not on file  . Intimate partner violence:    Fear of current or ex partner: Not on file    Emotionally abused: Not on file    Physically abused: Not on file    Forced sexual activity: Not on file  Other Topics Concern  . 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 Hypertension Mother   . Diabetes Mother   . Alcohol abuse Neg Hx   . Arthritis Neg Hx   . Cancer Neg Hx   . Early death Neg Hx   . Heart disease Neg Hx   . Hyperlipidemia Neg Hx   . Kidney disease Neg Hx   . Stroke Neg Hx   . Esophageal cancer Neg Hx   . Colon cancer Neg Hx      Current Outpatient Medications on File Prior to Visit  Medication Sig Dispense Refill  . acetaminophen (TYLENOL) 325 MG tablet Take 325 mg by mouth every 6 (six) hours as needed.    . diclofenac sodium (VOLTAREN) 1 % GEL Apply 2 g topically 4 (four) times daily. 100 g 2  . GLUCOSAMINE-CHONDROITIN PO Take 1 tablet by mouth daily.    . irbesartan-hydrochlorothiazide (AVALIDE) 300-12.5 MG  tablet Take by mouth.    . NONFORMULARY OR COMPOUNDED ITEM [facility] - Scar Cream 3 refills Sent in 01-01-19    . NONFORMULARY OR COMPOUNDED ITEM [facility]:  Scar Cream - Verapamil 10%, Pentoxifylline 5%. Apply 1-2 grams to affected area 3-4 times a day. 100 each 3  . nystatin-triamcinolone ointment (MYCOLOG) Apply 1 application topically 2 (two) times daily. 30 g 0  . rOPINIRole (REQUIP) 0.5 MG tablet TAKE 1 TABLET BY MOUTH 1 TO 3 HOURS BEFORE BEDTIME    . valsartan-hydrochlorothiazide (DIOVAN-HCT) 320-25 MG tablet Take by mouth.     No current facility-administered medications on file prior to visit.     Cardiovascular studies:  EKG 01/21/2019: Sinus rhythm 68 bpm. Normal EKG.   Recent labs: Glucose 129. BUN/Cr 20/0.9. eGFR 84. Na/K 140/3.7. Total protein 8.7 (6.4-8.2) Rest of the CMP normal H/H 15/43. MCV 86. Platelets 312.    Review of Systems  Constitution: Negative for decreased appetite, malaise/fatigue, weight gain and weight loss.  HENT: Negative for congestion.   Eyes: Negative for visual disturbance.  Cardiovascular: Positive for chest pain, dyspnea on exertion and near-syncope. Negative for leg swelling, palpitations and syncope.  Respiratory: Negative for cough.   Endocrine: Negative for cold intolerance.  Hematologic/Lymphatic: Does not bruise/bleed easily.  Skin: Negative for itching and rash.  Musculoskeletal: Negative for myalgias.  Gastrointestinal: Negative for abdominal pain, nausea and vomiting.  Genitourinary: Negative for dysuria.  Neurological: Negative for dizziness and weakness.  Psychiatric/Behavioral: The patient is not nervous/anxious.   All other systems reviewed and are negative.        Vitals:   01/26/19 1338  BP: 110/69  Pulse: 60  SpO2: 98%     Body mass index is 25.54 kg/m. Filed Weights   01/26/19 1338  Weight: 178 lb (80.7 kg)     Objective:   Physical Exam  Constitutional: He is oriented to  person, place, and time. He appears well-developed and well-nourished. No distress.  HENT:  Head: Normocephalic and atraumatic.  Eyes: Pupils are equal, round, and reactive to light. Conjunctivae are normal.  Neck: No JVD present.  Cardiovascular: Normal rate, regular rhythm and intact distal pulses.  Pulmonary/Chest: Effort normal and breath sounds normal. He has no wheezes. He has no rales.  Abdominal: Soft. Bowel sounds are normal. There is no rebound.  Musculoskeletal:  General: No edema.  Lymphadenopathy:    He has no cervical adenopathy.  Neurological: He is alert and oriented to person, place, and time. No cranial nerve deficit.  Skin: Skin is warm and dry.  Psychiatric: He has a normal mood and affect.  Nursing note and vitals reviewed.         Assessment & Recommendations:   68 year old Caucasian male, former smoker, controlled hypertension, with exertional chest tightness and near syncope.  Exertional chest tightness, near syncope: Physical exam and EKG unremarkable.  Symptoms are concerning for left main coronary artery disease versus vasovagal syncope.  Nuclear stress test may not reliably rule out left main disease, due to balanced ischemia.  Recommend coronary CT angiogram and echocardiogram.  Recommend aspirin 81 mg daily.  Will obtain baseline lipid panel from PCP.   Thank you for referring the patient to [place]. Please feel free to contact with any questions.  Nigel Mormon, MD [facility] Pager: [phone] Office: [phone] If no answer Cell [phone]

## 2019-02-04 ENCOUNTER — Telehealth: Payer: Self-pay

## 2019-02-04 DIAGNOSIS — M720 Palmar fascial fibromatosis [Dupuytren]: Secondary | ICD-10-CM | POA: Diagnosis not present

## 2019-02-04 NOTE — Telephone Encounter (Signed)
Pt's wife called and asked if you wanted Jacob Garrett to get a  CT scan or echo.

## 2019-02-05 ENCOUNTER — Telehealth: Payer: Self-pay

## 2019-02-05 ENCOUNTER — Other Ambulatory Visit: Payer: Self-pay | Admitting: Cardiology

## 2019-02-05 DIAGNOSIS — R55 Syncope and collapse: Secondary | ICD-10-CM

## 2019-02-05 NOTE — Telephone Encounter (Signed)
Pt's wife said his puilse has been low as 46  bp 120/76 Since taking 5 mg amlodipine

## 2019-02-05 NOTE — Telephone Encounter (Signed)
He has not passed out. Echo is scheduled for 03/19/2019

## 2019-02-05 NOTE — Telephone Encounter (Signed)
Has he had any more passing out episodes? If yes, then we may need to use event monitor.   Thanks MJP

## 2019-02-09 ENCOUNTER — Other Ambulatory Visit: Payer: Self-pay

## 2019-02-09 ENCOUNTER — Ambulatory Visit (INDEPENDENT_AMBULATORY_CARE_PROVIDER_SITE_OTHER): Payer: Medicare Other

## 2019-02-09 DIAGNOSIS — R079 Chest pain, unspecified: Secondary | ICD-10-CM | POA: Diagnosis not present

## 2019-02-09 DIAGNOSIS — R55 Syncope and collapse: Secondary | ICD-10-CM | POA: Diagnosis not present

## 2019-02-11 NOTE — Progress Notes (Signed)
S/W PATIENT ADVISED HIM NORMAL ECHO

## 2019-02-12 ENCOUNTER — Ambulatory Visit: Payer: Medicare Other | Admitting: Psychology

## 2019-02-13 ENCOUNTER — Other Ambulatory Visit (HOSPITAL_COMMUNITY): Payer: Self-pay | Admitting: Cardiology

## 2019-02-13 DIAGNOSIS — R55 Syncope and collapse: Secondary | ICD-10-CM | POA: Diagnosis not present

## 2019-02-14 LAB — BASIC METABOLIC PANEL
BUN/Creatinine Ratio: 19 (ref 10–24)
BUN: 17 mg/dL (ref 8–27)
CO2: 20 mmol/L (ref 20–29)
Calcium: 9.8 mg/dL (ref 8.6–10.2)
Chloride: 101 mmol/L (ref 96–106)
Creatinine, Ser: 0.88 mg/dL (ref 0.76–1.27)
GFR calc Af Amer: 103 mL/min/{1.73_m2} (ref >59)
GFR calc non Af Amer: 89 mL/min/{1.73_m2} (ref >59)
Glucose: 133 mg/dL — ABNORMAL HIGH (ref 65–99)
Potassium: 4.3 mmol/L (ref 3.5–5.2)
Sodium: 136 mmol/L (ref 134–144)

## 2019-02-17 ENCOUNTER — Telehealth (HOSPITAL_COMMUNITY): Payer: Self-pay | Admitting: Emergency Medicine

## 2019-02-17 NOTE — Telephone Encounter (Signed)
Reaching out to patient to offer assistance regarding upcoming cardiac imaging study; pt verbalizes understanding of appt date/time, parking situation and where to check in, pre-test NPO status and medications ordered, and verified current allergies; name and call back number provided for further questions should they arise Jasan Doughtie RN Navigator Cardiac Imaging [facility] [phone] office [phone] cell  Pt denies covid symptoms, verbalized understanding of visitor policy. 

## 2019-02-18 ENCOUNTER — Other Ambulatory Visit (HOSPITAL_COMMUNITY): Payer: Self-pay | Admitting: Internal Medicine

## 2019-02-18 ENCOUNTER — Ambulatory Visit (HOSPITAL_COMMUNITY)
Admission: RE | Admit: 2019-02-18 | Discharge: 2019-02-18 | Disposition: A | Discharge status: Home / Self Care | Payer: Medicare Other | Source: Ambulatory Visit | Attending: Cardiology | Admitting: Cardiology

## 2019-02-18 ENCOUNTER — Encounter: Payer: Medicare Other | Admitting: *Deleted

## 2019-02-18 ENCOUNTER — Other Ambulatory Visit: Payer: Self-pay

## 2019-02-18 DIAGNOSIS — I2510 Atherosclerotic heart disease of native coronary artery without angina pectoris: Secondary | ICD-10-CM | POA: Diagnosis not present

## 2019-02-18 DIAGNOSIS — R55 Syncope and collapse: Secondary | ICD-10-CM | POA: Insufficient documentation

## 2019-02-18 DIAGNOSIS — R079 Chest pain, unspecified: Secondary | ICD-10-CM | POA: Insufficient documentation

## 2019-02-18 DIAGNOSIS — R072 Precordial pain: Secondary | ICD-10-CM | POA: Insufficient documentation

## 2019-02-18 DIAGNOSIS — Z006 Encounter for examination for normal comparison and control in clinical research program: Secondary | ICD-10-CM

## 2019-02-18 MED ORDER — METOPROLOL TARTRATE 5 MG/5ML IV SOLN
5.0000 mg | INTRAVENOUS | Status: DC | PRN
  Filled 2019-02-18: qty 5

## 2019-02-18 MED ORDER — NITROGLYCERIN 0.4 MG SL SUBL
0.8000 mg | SUBLINGUAL_TABLET | Freq: Once | SUBLINGUAL | Status: AC
  Administered 2019-02-18: 0.8 mg via SUBLINGUAL

## 2019-02-18 MED ORDER — IOHEXOL 350 MG/ML SOLN
100.0000 mL | Freq: Once | INTRAVENOUS | Status: AC | PRN
  Administered 2019-02-18: 100 mL via INTRAVENOUS

## 2019-02-18 MED ORDER — NITROGLYCERIN 0.4 MG SL SUBL
SUBLINGUAL_TABLET | SUBLINGUAL | Status: AC
  Filled 2019-02-18: qty 2

## 2019-02-18 NOTE — Research (Signed)
Subject Name: Jacob Garrett  Subject met inclusion and exclusion criteria.  The informed consent form, study requirements and expectations were reviewed with the subject and questions and concerns were addressed prior to the signing of the consent form.  The subject verbalized understanding of the trial requirements.  The subject agreed to participate in the CADFEM G4 trial and signed the informed consent at [phone] on06/24/20.  The informed consent was obtained prior to performance of any protocol-specific procedures for the subject.  A copy of the signed informed consent was given to the subject and a copy was placed in the subject's medical record.   Star [facility] [place]

## 2019-02-19 DIAGNOSIS — N281 Cyst of kidney, acquired: Secondary | ICD-10-CM | POA: Diagnosis not present

## 2019-02-19 DIAGNOSIS — K824 Cholesterolosis of gallbladder: Secondary | ICD-10-CM | POA: Diagnosis not present

## 2019-02-23 ENCOUNTER — Encounter: Payer: Self-pay | Admitting: Cardiology

## 2019-02-23 ENCOUNTER — Ambulatory Visit (INDEPENDENT_AMBULATORY_CARE_PROVIDER_SITE_OTHER): Payer: Medicare Other | Admitting: Cardiology

## 2019-02-23 ENCOUNTER — Other Ambulatory Visit: Payer: Self-pay

## 2019-02-23 ENCOUNTER — Ambulatory Visit: Payer: Medicare Other

## 2019-02-23 VITALS — BP 121/73 | HR 74 | Temp 95.9°F | Ht 70.0 in | Wt 177.0 lb

## 2019-02-23 DIAGNOSIS — I2510 Atherosclerotic heart disease of native coronary artery without angina pectoris: Secondary | ICD-10-CM | POA: Diagnosis not present

## 2019-02-23 DIAGNOSIS — R55 Syncope and collapse: Secondary | ICD-10-CM

## 2019-02-23 MED ORDER — ROSUVASTATIN CALCIUM 5 MG PO TABS: 5.0000 mg | ORAL_TABLET | Freq: Every day | ORAL | 3 refills | Status: DC

## 2019-02-23 NOTE — Progress Notes (Signed)
Patient referred by Deland Pretty, MD for chest tightness  Subjective:   Jacob Garrett, male    DOB: [birth year], 68 y.o.   MRN: [id number]   Chief Complaint  Patient presents with  . Other    Pre-syncope  . Follow-up    HPI  68 year old Caucasian male, former smoker, controlled hypertension, with exertional chest tightness and near syncope.  Workup showed structurally normal heart, except aneurysmal interatrial septum, without PFO. CTA showed moderate, FFR negative CAD on CCTA. No left main disease was noted.   Patient has had at least two more episodes of presyncope during physical exertion, but denies any syncopal event. His blood pressure has been in good range.   Past Medical History:  Diagnosis Date  . Anxiety   . Calcific tendinitis of left shoulder   . Cluster headache 1990  . Colon polyps   . Dupuytren's contracture of both hands   . Elevated PSA   . Hypertriglyceridemia   . Inclusion cyst   . Inguinal hernia   . Pre-diabetes   . Thoracic nerve root impingement   . Thoracic spondylosis      Past Surgical History:  Procedure Laterality Date  . benign nodule removed from foot tendon    . CATARACT EXTRACTION, BILATERAL    . COLONOSCOPY W/ POLYPECTOMY     multiple  . Dupuytrens surgeries    . HERNIA REPAIR       Social History   Socioeconomic History  . Marital status: Married    Spouse name: Not on file  . Number of children: 3  . Years of education: Not on file  . Highest education level: Not on file  Occupational History  . Occupation: retired  Scientific laboratory technician  . Financial resource strain: Not on file  . Food insecurity    Worry: Not on file    Inability: Not on file  . Transportation needs    Medical: Not on file    Non-medical: Not on file  Tobacco Use  . Smoking status: Former Smoker    Packs/day: 1.00    Years: 25.00    Pack years: 25.00    Types: Cigarettes    Quit date: 05/26/1999    Years since quitting: 19.7  . Smokeless  tobacco: Never Used  Substance and Sexual Activity  . Alcohol use: Not Currently  . Drug use: Yes    Types: Marijuana  . Sexual activity: Yes  Lifestyle  . Physical activity    Days per week: Not on file    Minutes per session: Not on file  . Stress: Not on file  Relationships  . Social Herbalist on phone: Not on file    Gets together: Not on file    Attends religious service: Not on file    Active member of club or organization: Not on file    Attends meetings of clubs or organizations: Not on file    Relationship status: Not on file  . Intimate partner violence    Fear of current or ex partner: Not on file    Emotionally abused: Not on file    Physically abused: Not on file    Forced sexual activity: Not on file  Other Topics Concern  . 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 Hypertension Mother   . Diabetes Mother   . Alcohol abuse Neg Hx   . Arthritis Neg Hx   . Cancer Neg Hx   . Early death Neg Hx   . Heart disease Neg Hx   . Hyperlipidemia Neg Hx   . Kidney disease Neg Hx   . Stroke Neg Hx   . Esophageal cancer Neg Hx   . Colon cancer Neg Hx      Current Outpatient Medications on File Prior to Visit  Medication Sig Dispense Refill  . acetaminophen (TYLENOL) 325 MG tablet Take 325 mg by mouth every 6 (six) hours as needed.    Marland Kitchen amLODipine (NORVASC) 5 MG tablet Take 5 mg by mouth daily.    Marland Kitchen aspirin 81 MG chewable tablet Chew 81 mg by mouth daily.    . tamsulosin (FLOMAX) 0.4 MG CAPS capsule Take 0.4 mg by mouth daily.    . valsartan (DIOVAN) 320 MG tablet Take 320 mg by mouth daily.     No current facility-administered medications on file prior to visit.     Cardiovascular studies:   CTA 02/18/2019: Left Main:  No significant stenosis. FFR = 0.98 LAD: No significant stenosis. Proximal FFR = 0.94, Mid FFR =0.88, Distal FFR = 0.83, Diagonal branch proximal = 0.92, distal = 0.88 Ramus intermedius = proximal FFR = 0.97, distal = 0.95 LCX: No significant stenosis. Proximal FFR = 0.96, Distal FFR = 0.90 RCA: No significant stenosis. Proximal FFR = 0.96, Mid FFR =0.89, Distal FFR = 0.86  IMPRESSION: 1.  CT FFR analysis didn't show any significant stenosis.  Echocardiogram 02/09/2019: Normal LV systolic function with EF 57%. Left ventricle cavity is normal in size. Mild concentric hypertrophy of the left ventricle. Normal global wall motion. Normal diastolic filling pattern. Calculated EF 57%. Left atrial cavity is normal in size. Aneurysmal interatrial septum without 2D or color Doppler evidence of interatrial shunt. Mild (Grade I) mitral regurgitation. Inadequate TR jet to estimate pulmonary artery systolic pressure. Normal right atrial pressure.   EKG 01/21/2019: Sinus rhythm 68 bpm. Normal EKG.   Recent labs: Glucose 129. BUN/Cr 20/0.9. eGFR 84. Na/K 140/3.7. Total protein 8.7 (6.4-8.2) Rest of the CMP normal H/H 15/43. MCV 86. Platelets 312.    Review of Systems  Constitution: Negative for decreased appetite, malaise/fatigue, weight gain and weight loss.  HENT: Negative for congestion.   Eyes: Negative for visual disturbance.  Cardiovascular: Positive for chest pain, dyspnea on exertion and near-syncope. Negative for leg swelling, palpitations and syncope.  Respiratory: Negative for cough.   Endocrine: Negative for cold intolerance.  Hematologic/Lymphatic: Does not bruise/bleed easily.  Skin: Negative for itching and rash.  Musculoskeletal: Negative for myalgias.  Gastrointestinal: Negative for abdominal pain, nausea and vomiting.  Genitourinary: Negative for dysuria.  Neurological: Negative for dizziness and weakness.  Psychiatric/Behavioral: The patient  is not nervous/anxious.   All other systems reviewed and are negative.        There were no vitals filed for this visit.   There is no height or weight on file to calculate BMI. There were no vitals filed for this visit.   Objective:   Physical Exam  Constitutional: He is oriented to person, place, and time. He appears well-developed and well-nourished. No distress.  HENT:  Head: Normocephalic and atraumatic.  Eyes: Pupils are equal, round, and reactive to light. Conjunctivae are normal.  Neck: No JVD present.  Cardiovascular: Normal rate, regular rhythm and intact distal pulses.  Pulmonary/Chest: Effort normal and breath sounds normal. He has no wheezes. He has no rales.  Abdominal: Soft. Bowel sounds are normal. There is no rebound.  Musculoskeletal:        General: No edema.  Lymphadenopathy:    He has no cervical adenopathy.  Neurological: He is alert and oriented to person, place, and time. No cranial nerve deficit.  Skin: Skin is warm and dry.  Psychiatric: He has a normal mood and affect.  Nursing note and vitals reviewed.         Assessment & Recommendations:   68 year old Caucasian male, former smoker, controlled hypertension, with exertional chest tightness and near syncope.  Pre-syncope: Nonobstructive CAD, normal EF. No structural reason found for his symptoms. I suspect his episodes are likely related to orthostatic hypotension or vasovagal syncope. Recommend liberal hydration, counterpressure maneuvers explained to the patient. Also recommend wearing compression stockings. I have asked him to reduce amlodipine to 2.5 mg, and potentially stop it if BP stays >140/90 mmHg.  Recommend event monitor to rule put arrhythmic events.  Nonobstructive CAD: Recommend Aspirin 81 mg, Crestor 5 mg. Will check l;ipid panel.   Virtual visit f/u in 8 weeks.   Nigel Mormon, MD [facility] Pager: [phone] Office: [phone] If no answer  Cell [phone]

## 2019-02-24 ENCOUNTER — Encounter: Payer: Medicare Other | Attending: Psychology | Admitting: Psychology

## 2019-02-24 ENCOUNTER — Encounter

## 2019-02-24 DIAGNOSIS — R413 Other amnesia: Secondary | ICD-10-CM | POA: Insufficient documentation

## 2019-02-24 DIAGNOSIS — I2510 Atherosclerotic heart disease of native coronary artery without angina pectoris: Secondary | ICD-10-CM | POA: Diagnosis not present

## 2019-02-25 LAB — LIPID PANEL
Chol/HDL Ratio: 2.9 ratio (ref 0.0–5.0)
Cholesterol, Total: 168 mg/dL (ref 100–199)
HDL: 58 mg/dL (ref >39)
LDL Calculated: 79 mg/dL (ref 0–99)
Triglycerides: 155 mg/dL — ABNORMAL HIGH (ref 0–149)
VLDL Cholesterol Cal: 31 mg/dL (ref 5–40)

## 2019-02-25 NOTE — Progress Notes (Signed)
Pt aware.

## 2019-03-02 ENCOUNTER — Encounter: Payer: Self-pay | Admitting: Psychology

## 2019-03-02 ENCOUNTER — Other Ambulatory Visit: Payer: Self-pay

## 2019-03-02 ENCOUNTER — Encounter: Payer: Medicare Other | Attending: Psychology | Admitting: Psychology

## 2019-03-02 DIAGNOSIS — R413 Other amnesia: Secondary | ICD-10-CM | POA: Insufficient documentation

## 2019-03-02 DIAGNOSIS — R079 Chest pain, unspecified: Secondary | ICD-10-CM | POA: Diagnosis not present

## 2019-03-02 DIAGNOSIS — N400 Enlarged prostate without lower urinary tract symptoms: Secondary | ICD-10-CM | POA: Diagnosis not present

## 2019-03-02 DIAGNOSIS — R55 Syncope and collapse: Secondary | ICD-10-CM | POA: Diagnosis not present

## 2019-03-02 DIAGNOSIS — M720 Palmar fascial fibromatosis [Dupuytren]: Secondary | ICD-10-CM | POA: Diagnosis not present

## 2019-03-02 DIAGNOSIS — E785 Hyperlipidemia, unspecified: Secondary | ICD-10-CM | POA: Diagnosis not present

## 2019-03-02 DIAGNOSIS — I10 Essential (primary) hypertension: Secondary | ICD-10-CM | POA: Diagnosis not present

## 2019-03-02 DIAGNOSIS — I2510 Atherosclerotic heart disease of native coronary artery without angina pectoris: Secondary | ICD-10-CM | POA: Diagnosis not present

## 2019-03-02 DIAGNOSIS — Z01818 Encounter for other preprocedural examination: Secondary | ICD-10-CM | POA: Diagnosis not present

## 2019-03-02 NOTE — Progress Notes (Signed)
Patient arrived on time to his 8:00 testing appointment, which lasted 60 minutes. Full battery could not be completed due to problems with testing materials. He was administered the following subtests from the WAIS-IV: Symbol Search, Coding, & Cancellation. Patient has been rescheduled for 03/04/2019 at 8:00 to complete testing. Full results to come.

## 2019-03-03 DIAGNOSIS — Z1159 Encounter for screening for other viral diseases: Secondary | ICD-10-CM | POA: Diagnosis not present

## 2019-03-05 DIAGNOSIS — R001 Bradycardia, unspecified: Secondary | ICD-10-CM | POA: Diagnosis not present

## 2019-03-05 DIAGNOSIS — N400 Enlarged prostate without lower urinary tract symptoms: Secondary | ICD-10-CM | POA: Diagnosis not present

## 2019-03-05 DIAGNOSIS — Z87891 Personal history of nicotine dependence: Secondary | ICD-10-CM | POA: Diagnosis not present

## 2019-03-05 DIAGNOSIS — M720 Palmar fascial fibromatosis [Dupuytren]: Secondary | ICD-10-CM | POA: Diagnosis not present

## 2019-03-05 DIAGNOSIS — Z79899 Other long term (current) drug therapy: Secondary | ICD-10-CM | POA: Diagnosis not present

## 2019-03-05 DIAGNOSIS — E785 Hyperlipidemia, unspecified: Secondary | ICD-10-CM | POA: Diagnosis not present

## 2019-03-05 DIAGNOSIS — Z880 Allergy status to penicillin: Secondary | ICD-10-CM | POA: Diagnosis not present

## 2019-03-05 DIAGNOSIS — I2510 Atherosclerotic heart disease of native coronary artery without angina pectoris: Secondary | ICD-10-CM | POA: Diagnosis not present

## 2019-03-05 DIAGNOSIS — I10 Essential (primary) hypertension: Secondary | ICD-10-CM | POA: Diagnosis not present

## 2019-03-06 ENCOUNTER — Encounter: Payer: Medicare Other | Admitting: Psychology

## 2019-03-10 ENCOUNTER — Other Ambulatory Visit: Payer: Medicare Other

## 2019-03-15 ENCOUNTER — Encounter: Payer: Self-pay | Admitting: Psychology

## 2019-03-15 NOTE — Progress Notes (Signed)
Neuropsychological Consultation   Patient:   Jacob Garrett   DOB:   1951/06/02  MR Number:  [id number]  Location:  [facility] [place], [place] [id number] [place] [id number] Dept: [phone]           Date of Service:   02/24/2019  Start Time:   8 AM End Time:   10 AM  This was an in person visit that consisted of a 1 hour face-to-face clinical interview as well as 1 hour of medical records review, report writing and designing neuropsychological test battery.  Provider/Observer:  Ilean Skill, Psy.D.       Clinical Neuropsychologist       Billing Code/Service: 3810 6/9 6121  Chief Complaint:    Jacob Garrett is a 68 year old male referred by Dr. Shelia Media, MD for neuropsychological evaluation to establish a baseline of cognitive functioning due to concerns over some mild memory issues and expressive language issues related to remembering words and substituting words at times.  Reason for Service:  Jacob Garrett is a 68 year old male referred by Dr. Shelia Media, MD for neuropsychological evaluation to establish a baseline of cognitive functioning due to concerns over some mild memory issues and expressive language issues related to remembering words and substituting words at times.  The patient reports he for started noticing issues with remembering what he had read as well as word finding issues about 1 year ago.  He reports noting subtle changes over time regarding short-term memory issues.  He denies any significant or serious changes and he just wanted to come in and have baseline testing to provide accurate assessments down the road if these memory issues with.  The patient reports that he had significant cluster headaches between the age of 6 and 33 but these cluster headaches stopped about 10 years ago.  The patient reports that he has had some issues related to hypertensive  disorder.  The patient reports he has had several cardiac tests recently and everything is good so far.  His blood pressure is been managed well for years but now his blood pressure is is quite variable ranging from high to low and low pulse at times.  The patient reports that he has had some sleep issues after starting on blood pressure meds.  The patient reports that he is having to get up every 1-1/2 hours to urinate and recently started taking Flomax which has helped some but he still has to get up every 4-1/2 hours.  The patient reports that he goes to bed around 9 PM and gets up at 6 AM and does feel tired much of the time.  The patient describes himself as quiet and calm but does have times of abrupt anger.  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  Current Status:  The patient reports mild issues related to short-term memory and memory changes as well as some very mild expressive language issues primarily with word substitution and difficulty recalling words that he would later recall.  Reliability of Information: The information is derived from 1 hour face-to-face clinical interview with the patient as well as review of available medical records.  Behavioral Observation: Jacob Garrett  presents as a 68 y.o.-year-old Right Caucasian Male who appeared his stated age. his dress was Appropriate and he was Well Groomed and his manners were Appropriate to the  situation.  his participation was indicative of Appropriate and Attentive behaviors.  There were not any physical disabilities noted.  he displayed an appropriate level of cooperation and motivation.     Interactions:    Active Appropriate and Attentive  Attention:   within normal limits and attention span and concentration were age appropriate  Memory:   within normal limits; recent and remote memory intact  Visuo-spatial:  not examined  Speech  (Volume):  normal  Speech:   normal; normal  Thought Process:  Coherent and Relevant  Though Content:  WNL; not suicidal and not homicidal  Orientation:   person, place, time/date and situation  Judgment:   Good  Planning:   Good  Affect:    Appropriate  Mood:    Euthymic  Insight:   Good  Intelligence:   high  Marital Status/Living: The patient was born in [place] with 2 siblings.  He is married and continues to live with his wife of 45 years.  The patient has 15 adult male children who are all doing well with no significant issues although there is some anxiety.  Current Employment: The patient is retired.  Hobbies and interests include working in Biomedical scientist, fishing, reading.  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  Substance Use:  No concerns of substance abuse are reported.  The patient denies any substance abuse but does acknowledge infrequent use of cannabis.  Education:   HS Graduate  Medical History:   Past Medical History:  Diagnosis Date  . Anxiety   . Calcific tendinitis of left shoulder   . Cluster headache 1990  . Colon polyps   . Dupuytren's contracture of both hands   . Elevated PSA   . Hypertriglyceridemia   . Inclusion cyst   . Inguinal hernia   . Pre-diabetes   . Thoracic nerve root impingement   . Thoracic spondylosis      Psychiatric History:  The patient does have a prior history of anxiety symptoms and disorder with treatment for agoraphobia in the past.  Family Med/Psych History:  Family History  Problem Relation Age of Onset  . Hypertension Mother   . Diabetes Mother   . Alcohol abuse Neg Hx   . Arthritis Neg Hx   . Cancer Neg Hx   . Early death Neg Hx   . Heart disease Neg Hx   . Hyperlipidemia Neg Hx   . Kidney disease Neg Hx   . Stroke Neg Hx   . Esophageal cancer Neg Hx   . Colon cancer Neg Hx     Risk of  Suicide/Violence: virtually non-existent the patient denies any suicidal homicidal ideation.  Impression/DX:  Jacob Garrett is a 68 year old male referred by Dr. Shelia Media, MD for neuropsychological evaluation to establish a baseline of cognitive functioning due to concerns over some mild memory issues and expressive language issues related to remembering words and substituting words at times.  The patient reports mild issues related to short-term memory and memory changes as well as some very mild expressive language issues primarily with word substitution and difficulty recalling words that he would later recall.  Disposition/Plan:  We have set the patient up for formal neuropsychological testing utilizing the Wechsler Adult Intelligence Scale-IV as well as the Wechsler Memory Scale-IV.  Diagnosis:    Memory loss         Electronically Signed   _______________________ Ilean Skill, Psy.D.

## 2019-03-19 ENCOUNTER — Other Ambulatory Visit: Payer: Medicare Other

## 2019-03-19 ENCOUNTER — Ambulatory Visit: Payer: Medicare Other | Admitting: Cardiology

## 2019-03-20 ENCOUNTER — Encounter: Payer: Self-pay | Admitting: Psychology

## 2019-03-20 ENCOUNTER — Other Ambulatory Visit: Payer: Self-pay

## 2019-03-20 ENCOUNTER — Encounter: Payer: Medicare Other | Admitting: Psychology

## 2019-03-20 DIAGNOSIS — R413 Other amnesia: Secondary | ICD-10-CM | POA: Diagnosis not present

## 2019-03-20 NOTE — Progress Notes (Signed)
The patient arrived on time to his 8:00 testing appointment. The evaluation lasted 240 minutes.  Behavioral Observations:  Appearance: Casually and appropriately dressed/groomed.  Gait: Ambulated independently without difficulty Speech: Clear, normal rate, tone, & volume Thought process: Logical, linear, organized  Affect: Mildly Depressed and irritable, appropriate   Interpersonal: Polite, appropriate  Orientation: Oriented x 4 Effort/Motivation: Excellent   Patient did not any difficulty hearing or understanding test instructions and did not require additional prompting. He exhibited poor frustration tolerance on questions he did not know or tasks that were more difficult but persisted well overall.    Tests Administered:  Jarome Lamas Executive Function System   Wechsler Adult Intelligence Scale-Third Edition (WAIS-IV)  Wechsler Memory Scale-Fourth Edition (WMS-IV) Adult Battery (ages 63-69)  Results of D-KEFS Trail Making Test  . Visual Scanning  o Time=19", ss=13, 84th %, High Average   . Number Sequencing  o Time=39", ss=12, 75th %, Average . Letter Sequencing  o Time=34", ss=12, 75th %, Average . Number-Letter Switching o Time=64", ss=13, 84th %, High Average   . Motor Speed  o Time=20", ss=13, 84th %, High Average   . Combined Number & Letter Sequencing  o ss=13, 84th %, High Average    Results of the WAIS-IV:  Composite Score Summary  Scale Sum of Scaled Scores Composite Score Percentile Rank 95% Conf. Interval Qualitative Description  Verbal Comprehension 40 VCI 118 88 112-123 High Average  Perceptual Reasoning 36 PRI 111 77 104-117 High Average  Working Memory 27 WMI 119 90 111-125 High Average  Processing Speed 22 PSI 105 63 96-113 Average  Full Scale 125 FSIQ 117 87 113-121 High Average  General Ability 76 GAI 117 87 112-121 High Average   Index Level Discrepancy Comparisons  Comparison Score 1 Score 2 Difference Critical Value .05 Significant  Difference Y/N Base Rate by Overall Sample  VCI - PRI 118 111 7 8.31 N 30.5  VCI - WMI 118 119 -1 8.82 N 48.1  VCI - PSI 118 105 13 10.19 Y 20.3  PRI - WMI 111 119 -8 9.74 N 28.9  PRI - PSI 111 105 6 11.00 N 34.5  WMI - PSI 119 105 14 11.38 Y 18.1  FSIQ - GAI 117 117 0 3.08 N    Differences Between Subtest and Overall Mean of Subtest Scores  Subtest Subtest Scaled Score Mean Scaled Score Difference Critical Value .05 Strength or Weakness Base Rate  Block Design 13 12.50 0.50 2.85  >25%  Similarities 12 12.50 -0.50 2.82  >25%  Digit Span 14 12.50 1.50 2.22  >25%  Matrix Reasoning 10 12.50 -2.50 2.54  15-25%  Vocabulary 14 12.50 1.50 2.03  >25%  Arithmetic 13 12.50 0.50 2.73  >25%  Symbol Search 10 12.50 -2.50 3.42  >25%  Visual Puzzles 13 12.50 0.50 2.71  >25%  Information 14 12.50 1.50 2.19  >25%  Coding 12 12.50 -0.50 2.97  >25%   Results of the WMS-IV (Adult Battery):  Brief Cognitive Status Exam Classification  Age Years of Education Raw Score Classification Level Base Rate  67 years 8 months 12 52 Average 100.0   Index Score Summary  Index Sum of Scaled Scores Index Score Percentile Rank 95% Confidence Interval Qualitative Descriptor  Auditory Memory (AMI) 42 102 55 96-108 Average  Visual Memory (VMI) 43 104 61 98-109 Average  Visual Working Memory (VWMI) 25 115 84 107-121 High Average  Immediate Memory (IMI) 44 107 68 100-113 Average  Delayed Memory (DMI) 41 102 55  95-109 Average   Primary Subtest Scaled Score Summary  Subtest Domain Raw Score Scaled Score Percentile Rank  Logical Memory I AM 28 11 63  Logical Memory II AM 24 11 63  Verbal Paired Associates I AM 28 10 50  Verbal Paired Associates II AM 9 10 50  Designs I VM 58 9 37  Designs II VM 64 14 91  Visual Reproduction I VM 40 14 91  Visual Reproduction II VM 9 6 9   Spatial Addition VWM 13 12 75  Symbol Span VWM 26 13 84   PROCESS SCORE CONVERSIONS  Auditory Memory Process Score Summary  Process  Score Raw Score Scaled Score Percentile Rank Cumulative Percentage (Base Rate)  LM II Recognition 25 - - 51-75%  VPA II Recognition 38 - - 51-75%   Visual Memory Process Score Summary  Process Score Raw Score Scaled Score Percentile Rank Cumulative Percentage (Base Rate)  DE I Content 32 9 37 -  DE I Spatial 14 9 37 -  DE II Content 37 13 84 -  DE II Spatial 15 13 84 -  DE II Recognition 14 - - 51-75%  VR II Recognition 7 - - >75%

## 2019-04-21 ENCOUNTER — Encounter: Payer: Self-pay | Admitting: Psychology

## 2019-04-21 ENCOUNTER — Other Ambulatory Visit: Payer: Self-pay

## 2019-04-21 ENCOUNTER — Encounter: Payer: Medicare Other | Attending: Psychology | Admitting: Psychology

## 2019-04-21 DIAGNOSIS — R413 Other amnesia: Secondary | ICD-10-CM | POA: Insufficient documentation

## 2019-04-21 DIAGNOSIS — G479 Sleep disorder, unspecified: Secondary | ICD-10-CM | POA: Insufficient documentation

## 2019-04-21 DIAGNOSIS — Z79899 Other long term (current) drug therapy: Secondary | ICD-10-CM | POA: Diagnosis not present

## 2019-04-21 DIAGNOSIS — F4000 Agoraphobia, unspecified: Secondary | ICD-10-CM | POA: Diagnosis not present

## 2019-04-21 NOTE — Progress Notes (Signed)
Neuropsychological Evaluation   Patient:  Jacob Garrett   DOB: 06/09/1951  MR Number: [id number]  Location: [facility] [place], [id number] [place] [id number] Dept: [phone]  Start: 8 AM End: 9 AM  Provider/Observer:     Edgardo Roys PsyD  Chief Complaint:      Chief Complaint  Patient presents with   Memory Loss    Reason For Service:     Sierra Wenzlick is a 68 year old male referred by Dr. Shelia Media, MD for neuropsychological evaluation to establish a baseline of cognitive functioning due to concerns over some mild memory issues and expressive language issues related to remembering words and substituting words at times.  The patient reports he started noticing issues with remembering what he had read as well as word finding issues about 1 year ago.  He reports noting subtle changes over time regarding short-term memory issues.  He denies any significant or serious changes and he just wanted to come in and have baseline testing to provide accurate assessments down the road if these memory issues with.  The patient reports that he had significant cluster headaches between the age of 62 and 12 but these cluster headaches stopped about 10 years ago.  The patient reports that he has had some issues related to hypertensive disorder.  The patient reports he has had several cardiac tests recently and everything is good so far.  His blood pressure is being managed well for years but now his blood pressure is quite variable ranging from high to low and low pulse at times.  The patient reports that he has had some sleep issues after starting on blood pressure meds.  The patient reports that he is having to get up every 1-1/2 hours to urinate and recently started taking Flomax which has helped some but he still has to get up every 4-1/2 hours.  The patient reports that he  goes to bed around 9 PM and gets up at 6 AM and does feel tired much of the time.  The patient describes himself as quiet and calm but does have times of abrupt anger.  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  The patient arrived on time to his 8:00 testing appointment. The evaluation lasted 240 minutes.  Behavioral Observations: Appearance:Casually and appropriately dressed/groomed.  Gait:Ambulated independently without difficulty Speech:Clear, normal rate, tone, & volume Thought process:Logical, linear, organized  Affect:Mildly Depressed and irritable, appropriate   Interpersonal: Polite, appropriate  Orientation: Oriented x 4 Effort/Motivation: Excellent   Patient did not any difficulty hearing or understanding test instructions and did not require additional prompting. He exhibited poor frustration tolerance on questions he did not know or tasks that were more difficult but persisted well overall.    Tests Administered:  Jarome Lamas Executive Function System   Wechsler Adult Intelligence Scale-Third Edition (WAIS-IV)  Wechsler Memory Scale-Fourth Edition (WMS-IV) Adult Battery (ages 71-69)   Test Results:   The patient appeared to try his hardest throughout this testing procedure and this does appear to be a fair and valid assessment of his current cognitive functioning.  Composite Score Summary  Scale Sum of Scaled Scores Composite Score Percentile Rank 95% Conf. Interval Qualitative Description  Verbal Comprehension 40 VCI 118 88 112-123 High Average  Perceptual Reasoning 36 PRI 111 77 104-117 High Average  Working Memory  27 WMI 119 90 111-125 High Average  Processing Speed 22 PSI 105 63 96-113 Average  Full Scale 125 FSIQ 117 87 113-121 High Average  General Ability 76 GAI 117 87 112-121 High Average   Initially, the patient was administered the Wechsler Adult  Intelligence Scale-IV.  The patient produced a full scale IQ score of 117 which falls at the 87th percentile and is in the high average range.  The patient is current global and intellectual functioning do appear to be generally consistent with his occupational history and educational history.  We also calculated the patient's general abilities index score which places less emphasis on information processing speed and working memory which can be affected by acute changes more readily than some of the other score such as verbal comprehension and visual-spatial skills.  The patient is general abilities index score was also 117 which also fell at the 87th percentile and was in the average range.  Verbal Comprehension Subtests Summary  Subtest Raw Score Scaled Score Percentile Rank Reference Group Scaled Score SEM  Similarities 29 12 75 12 1.04  Vocabulary 52 14 91 16 0.67  Information 22 14 91 16 0.73  (Comprehension) 33 16 98 16 1.08   The patient produced a verbal comprehension index score of 118 which falls at the 88th percentile and is in the high average range.  There was little variability within subtest performance although the patient did perform significantly better on measures of social comprehension and judgment variables.  The patient produced in the upper end of the average range to high average range with regard to his verbal reasoning and problem-solving, vocabulary knowledge, and his general fund of information.  Perceptual Reasoning Subtests Summary  Subtest Raw Score Scaled Score Percentile Rank Reference Group Scaled Score SEM  Block Design 44 13 84 10 1.08  Matrix Reasoning 14 10 50 7 0.90  Visual Puzzles 15 13 84 10 0.85  (Figure Weights) 16 13 84 10 0.95  (Picture Completion) 11 10 50 8 1.16    The patient produced a perceptual reasoning index score of 111 which falls at the 77th percentile and is in the high average range.  There was little variability within subtest  performance and the patient produced performances in the average range with regard to his visual reasoning and problem-solving abilities and his ability to identify visual anomalies within a visual gestalt.  High average performance were noted with regard to visual analysis and organization as well as visual estimation and visual judgment abilities.  Working Doctor, general practice Raw Score Scaled Score Percentile Rank Reference Group Scaled Score SEM  Digit Span 34 14 91 13 0.79  Arithmetic 17 13 84 12 0.99  (Letter-Number Seq.) 25 18 99.6 16 1.04    The patient produced a working memory index score of 119 which falls at the 90th percentile and is in the high average range of functioning.  The patient did quite well on all measures of auditory encoding and did exceptionally well on measures of multi processing of auditorily encoded information.  Processing Speed Subtests Summary  Subtest Raw Score Scaled Score Percentile Rank Reference Group Scaled Score SEM  Symbol Search 26 10 50 7 1.31  Coding 66 12 75 9 0.99  (Cancellation) 40 11 63 10 1.34    The patient produced a processing speed index score of 105 which falls at the 63rd percentile and is in the average range.  Relatively speaking, this is his lowest area of  functioning but still falls in the average range with no indications of significant issues a processing speed relative to a normative population.  This component of cognitive functioning tends to be less impacted by educational levels but again there are no indications of any significant deficits in this area.  Wechsler Memory Scale-IV   Index Score Summary  Index Sum of Scaled Scores Index Score Percentile Rank 95% Confidence Interval Qualitative Descriptor  Auditory Memory (AMI) 42 102 55 96-108 Average  Visual Memory (VMI) 43 104 61 98-109 Average  Visual Working Memory (VWMI) 25 115 84 107-121 High Average  Immediate Memory (IMI) 44 107 68 100-113 Average   Delayed Memory (DMI) 41 102 55 95-109 Average    The patient was then administered the Wechsler Memory Scale-IV.  The patient's performances all fell in the average range to high average range.  Similar to his performance on working memory/auditory encoding the patient also did very well on visual working memory index/visual encoding measures.  The patient produced a visual working memory index score of 115 which falls at the 84th percentile and is in the high average range.  The patient's auditory and visual encoding abilities appear to be consistent with one another and there were no indications of any encoding/attentional deficits.  The patient produced an auditory memory index score of 102 which falls at the 55th percentile and is in the average range.  This performance is also consistent with his visual memory index score which was a 104 and fell at the 61st percentile.  This performance is also in the average range.  There was no significant difference between auditory versus visual memory when taken in isolation of one another.  Combining auditory and visual memory functions the patient produced an immediate memory index score of 107 which falls at the 68th percentile and is in the average range.  The patient showed little deterioration of memory functions as a measure of delay suggesting that he is able to encode, organize, and retrieve that information after period of delay.  The patient produced a delayed memory index score of 102 which falls at the 55th percentile and is in the average range.   ABILITY-MEMORY ANALYSIS  Ability Score:  GAI: 117 Date of Testing:  WAIS-IV 2019/03/20; WMS-IV 2019/03/20  Predicted Difference Method   Index Predicted WMS-IV Index Score Actual WMS-IV Index Score Difference Critical Value  Significant Difference Y/N Base Rate  Auditory Memory 109 102 7 9.98 N   Visual Memory 110 104 6 9.30 N   Visual Working Memory 111 115 -4 11.60 N   Immediate  Memory 111 107 4 10.99 N   Delayed Memory 110 102 8 11.44 N   Statistical significance (critical value) at the .01 level.   To further analyze the patient's memory functions we utilize the abilities-memory analysis function where a predicted score was produced based on his general abilities index score of 117 from the Wechsler Adult Intelligence Scale.  This general abilities index score is then used to make predictions of where he should perform on various memory task.  While he generally performed just below predicted level on memory task versus predicted levels none of his performances were significantly different than predicted levels and there was no indication of any significant or obvious memory deficits.  The patient performed better than predicted levels with regard to visual working memory and is clearly 1 of his strengths.  The patient's immediate memory, delayed memory, auditory memory, and visual memory functions were all  statistically consistent with his predicted levels but slightly below predicted levels.   Results of D-KEFS Trail Making Test   Visual Scanning  ? Time=19, ss=13, 84th %, High Average    Number Sequencing  ? Time=39, ss=12, 75th %, Average  Letter Sequencing  ? Time=34, ss=12, 75th %, Average  Number-Letter Switching ? Time=64, ss=13, 84th %, High Average    Motor Speed  ? Time=20, ss=13, 84th %, High Average    Combined Number & Letter Sequencing  ? ss=13, 84th %, High Average    The patient was then given a number of measures looking at information processing speed motor speed and cognitive flexibility.  We utilize these particular measures due to the fact that the patient has a history of issues related to high blood pressure and describes some slowing in cognitive functioning and word finding difficulties.  These measures place a heavy loading on white matter tracts in the brain which can be affected with issues such as small vessel disease that is  associated with chronic high blood pressure.  The patient's performance on all measures including visual scanning and visual searching, cognitive flexibility, fine motor speed, and multi processing abilities were all in the average to high average range and there were no consistent findings of any deficits in these measures therefore not suggesting issues related to microvascular ischemia.   Summary of Results:   Overall, the results of the current neuropsychological evaluation are not consistent with any particular findings of neuropsychological deficits or cognitive dysfunction.  The patient produced a general abilities index score in the high average range that was consistent with his full-scale IQ score which was also in the high average range.  Both of these measures would be consistent with predicted levels based on his educational and occupational history.  The patient strengths lie with regard to his auditory and visual encoding abilities which are the prerequisite and starting point for various memory abilities.  The patient also showed relative strengths both to himself as well as to a normative population with regard to his verbal comprehension abilities.  The patient showed good visual spatial abilities and visual analysis and organizational abilities.  While relatively speaking his processing speed was lower than his average overall cognitive functioning all of the measures assessing information processing speed, cognitive flexibility, and shifting/sequencing abilities were in the average to high average range.  While generally speaking, the patient's memory functions were slightly below predicted levels based on his general abilities index score and his educational history there were still within normal limits and there were no clear patterns of deficits.  The patient's visual and auditory memory are both performing consistent with each other.  The patient showed good ability to initially learn  information and was able to effectively organize, store and retrieve that information after period of delay.  There were no objective findings of any memory deficits per se.  Impression/Diagnosis:   Overall, the results of the current neuropsychological evaluation are not consistent with any consistent pattern of any neuropsychological or cognitive deficits.  While the patient did have some mild decrease in memory functions relative to predicted levels these were still not statistically significant and there were no indications of any attentional/encoding deficits, visual-spatial deficits or attentional deficits noted.  While the patient does describe some expressive language issues related to word finding which could be related to some early stages of microvascular ischemia/small vessel disease other measures such as information processing speed and cognitive shifting were all quite good  and therefore there is no consistent pattern of small vessel disease types of symptoms on his cognitive testing.  He does have a long history of dealing with high blood pressure issues and going forward he should closely monitor this and follow his cardiologist recommendations regarding managing his high blood pressure consistently.  The patient also has had some recent issues with sleep disturbance following started on various blood pressure medicines and this could create some acute/functional memory difficulties and may be the primary culprit potentially along with some anxiety symptoms creating some of the subjective symptoms of memory weaknesses.  However, there does not appear to be any consistent findings of any neuropsychological deficits.  We now have very strong baseline measures for the patient going forward if there are any progressive changes that are noted.  Initially, the first evaluations are utilizing comparisons to normative populations and now with his baseline we will be able to make direct comparisons  between the patient's current functioning with any future assessments if warranted.  Diagnosis:     Memory loss   Ilean Skill, Psy.D. Neuropsychologist   WAIS-IV Wechsler Adult Intelligence Scale-Fourth Edition Score Report   Examinee Name Shequille Wasdin  Date of Report 2019/04/21  Justice Rocher [id number]  Years of Education   Date of Birth 05-10-1951  Primary Language   Gender Male  Handedness   Race/Ethnicity   Examiner Name Ilean Skill  Date of Testing 2019/03/20  Age at Testing 41 years 8 months  Retest? No   Comments: Composite Score Summary  Scale Sum of Scaled Scores Composite Score Percentile Rank 95% Conf. Interval Qualitative Description  Verbal Comprehension 40 VCI 118 88 112-123 High Average  Perceptual Reasoning 36 PRI 111 77 104-117 High Average  Working Memory 27 WMI 119 90 111-125 High Average  Processing Speed 22 PSI 105 63 96-113 Average  Full Scale 125 FSIQ 117 87 113-121 High Average  General Ability 76 GAI 117 87 112-121 High Average  Confidence Intervals are based on the Overall Average SEMs. The [facility] is an optional composite summary score that is less sensitive to the influence of working memory and processing speed. Because working memory and processing speed are vital to a comprehensive evaluation of cognitive ability, it should be noted that the [facility] does not have the breadth of construct coverage as the FSIQ.   ANALYSIS   Index Level Discrepancy Comparisons  Comparison Score 1 Score 2 Difference Critical Value .05 Significant Difference Y/N Base Rate by Overall Sample  VCI - PRI 118 111 7 8.31 N 30.5  VCI - WMI 118 119 -1 8.82 N 48.1  VCI - PSI 118 105 13 10.19 Y 20.3  PRI - WMI 111 119 -8 9.74 N 28.9  PRI - PSI 111 105 6 11.00 N 34.5  WMI - PSI 119 105 14 11.38 Y 18.1  FSIQ - GAI 117 117 0 3.08 N   Base Rate by Overall Sample. Statistical significance (critical value) at the .05 level.  Verbal Comprehension Subtests Summary    Subtest Raw Score Scaled Score Percentile Rank Reference Group Scaled Score SEM  Similarities 29 12 75 12 1.04  Vocabulary 52 14 91 16 0.67  Information 22 14 91 16 0.73  (Comprehension) 33 16 98 16 1.08   Perceptual Reasoning Subtests Summary  Subtest Raw Score Scaled Score Percentile Rank Reference Group Scaled Score SEM  Block Design 44 13 84 10 1.08  Matrix Reasoning 14 10 50 7 0.90  Visual Puzzles 15 13 84  10 0.85  (Figure Weights) 16 13 84 10 0.95  (Picture Completion) 11 10 50 8 1.16   Working Doctor, general practice Raw Score Scaled Score Percentile Rank Reference Group Scaled Score SEM  Digit Span 34 14 91 13 0.79  Arithmetic 17 13 84 12 0.99  (Letter-Number Seq.) 25 18 99.6 16 1.04   Processing Speed Subtests Summary  Subtest Raw Score Scaled Score Percentile Rank Reference Group Scaled Score SEM  Symbol Search 26 10 50 7 1.31  Coding 66 12 75 9 0.99  (Cancellation) 40 11 63 10 1.34   Subtest Level Discrepancy Comparisons  Subtest Comparison Score 1 Score 2 Difference Critical Value .05 Significant Difference Y/N Base Rate  Digit Span - Arithmetic 14 13 1  2.57 N 42.80  Symbol Search - Coding 10 12 -2 3.41 N 25.70  Statistical significance (critical value) at the .05 level.    DETERMINING STRENGTHS AND WEAKNESSES   Differences Between Subtest and Overall Mean of Subtest Scores  Subtest Subtest Scaled Score Mean Scaled Score Difference Critical Value .05 Strength or Weakness Base Rate  Block Design 13 12.50 0.50 2.85  >25%  Similarities 12 12.50 -0.50 2.82  >25%  Digit Span 14 12.50 1.50 2.22  >25%  Matrix Reasoning 10 12.50 -2.50 2.54  15-25%  Vocabulary 14 12.50 1.50 2.03  >25%  Arithmetic 13 12.50 0.50 2.73  >25%  Symbol Search 10 12.50 -2.50 3.42  >25%  Visual Puzzles 13 12.50 0.50 2.71  >25%  Information 14 12.50 1.50 2.19  >25%  Coding 12 12.50 -0.50 2.97  >25%  Overall: Mean = 12.50, Scatter =  4, Base rate =  96.1 Base Rate for  Intersubtest Scatter is reported for 10 Subtests. Statistical significance (critical value) at the .05 level.   PROCESS ANALYSIS   Perceptual Reasoning Process Score Summary  Process Score Raw Score Scaled Score Percentile Rank SEM  Block Design No Time Bonus 40 13 84 1.16    Working Memory Process Score Summary  Process Score Raw Score Scaled Score Percentile Rank Base Rate SEM  Digit Span Forward 13 14 91 -- 1.37  Digit Span Backward 8 10 50 -- 1.41  Digit Span Sequencing 13 18 99.6 -- 1.31  Longest Digit Span Forward 8 -- -- 26.0 --  Longest Digit Span Backward 4 -- -- 76.5 --  Longest Digit Span Sequence 9 -- -- 0.5 --  Longest Letter-Number Sequence 7 -- -- 10.0 --    Process Level Discrepancy Comparisons  Process Comparison Score 1 Score 2 Difference Critical Value .05 Significant Difference Y/N Base Rate  Block Design - Block Design No Time Bonus 13 13 0 3.08 N   Digit Span Forward - Digit Span Backward 14 10 4  3.65 Y 10.3  Digit Span Forward - Digit Span Sequencing 14 18 -4 3.60 Y 13.0  Digit Span Backward - Digit Span Sequencing 10 18 -8 3.56 Y 0.5  Longest Digit Span Forward - Longest Digit Span Backward 8 4 4  -- -- 10.5  Longest Digit Span Forward - Longest Digit Span Sequence 8 9 -1 -- -- 15.0  Longest Digit Span Backward - Longest Digit Span Sequence 4 9 -5 -- -- 0.0  Statistical significance (critical value) at the .05 level.   Raw Scores  Subtest Score Range Raw Score  Process Score Range Raw Score  Block Design 0-66 44  Block Design No Time Bonus 0-48 40  Similarities 0-36 29  Digit Span Forward 0-16 13  Digit Span 0-48 34  Digit Span Backward 0-16 8  Matrix Reasoning 0-26 14  Digit Span Sequencing 0-16 13  Vocabulary 0-57 52  Longest Digit Span Forward 0, 2-9 8  Arithmetic 0-22 17  Longest Digit Span Backward 0, 2-8 4  Symbol Search 0-60 26  Longest Digit Span Sequence 0, 2-9 9  Visual Puzzles 0-26 15  Longest Letter-Number Seq. 0, 2-8 7    Information 0-26 22      Coding 0-135 66      Letter-Number Seq. 0-30 25      Figure Weights 0-27 16      Comprehension 0-36 33      Cancellation 0-72 40      Picture Completion 0-24 11          WMS-IV Wechsler Memory Scale-Fourth Edition Score Report   Examinee Name Siah Mews  Date of Report 2019/04/21  Kingsley Spittle ID [id number]  Years of Education 31  Date of Birth [birth year]  Home Language   Gender Male  Handedness Not Specified  Race/Ethnicity   Examiner Name Ilean Skill  Date of Testing 2019/03/20  Age at Testing 42 years 8 months  Retest? No   Comments: Brief Cognitive Status Exam Classification  Age Years of Education Raw Score Classification Level Base Rate  67 years 8 months 16 52 Low Average 22.1    Index Score Summary  Index Sum of Scaled Scores Index Score Percentile Rank 95% Confidence Interval Qualitative Descriptor  Auditory Memory (AMI) 42 102 55 96-108 Average  Visual Memory (VMI) 43 104 61 98-109 Average  Visual Working Memory (VWMI) 25 115 84 107-121 High Average  Immediate Memory (IMI) 44 107 68 100-113 Average  Delayed Memory (DMI) 41 102 55 95-109 Average    Index Score Profile   Primary Subtest Scaled Score Summary  Subtest Domain Raw Score Scaled Score Percentile Rank  Logical Memory I AM 28 11 63  Logical Memory II AM 24 11 63  Verbal Paired Associates I AM 28 10 50  Verbal [employer] II AM 9 10 50  Designs I VM 58 9 37  Designs II VM 64 14 91  Visual Reproduction I VM 40 14 91  Visual Reproduction II VM 9 6 9   Spatial Addition VWM 13 12 75  Symbol Span VWM 26 13 84   Primary Subtest Scaled Score Profile    PROCESS SCORE CONVERSIONS  Auditory Memory Process Score Summary  Process Score Raw Score Scaled Score Percentile Rank Cumulative Percentage (Base Rate)  LM II Recognition 25 - - 51-75%  VPA II Recognition 38 - - 51-75%   Visual Memory Process Score Summary  Process Score Raw Score Scaled Score Percentile  Rank Cumulative Percentage (Base Rate)  DE I Content 32 9 37 -  DE I Spatial 14 9 37 -  DE II Content 37 13 84 -  DE II Spatial 15 13 84 -  DE II Recognition 14 - - 51-75%  VR II Recognition 7 - - >75%    SUBTEST-LEVEL DIFFERENCES WITHIN INDEXES  Auditory Memory Index  Subtest Scaled Score AMI Mean Score Difference from Mean Critical Value Base Rate  Logical Memory I 11 10.50 0.50 2.64 >25%  Logical Memory II 11 10.50 0.50 2.48 >25%  Verbal [employer] I 10 10.50 -0.50 1.90 >25%  Verbal [employer] Associates II 10 10.50 -0.50 2.48 >25%  Statistical significance (critical value) at the .05 level.   Visual Memory Index  Subtest Scaled Score VMI Mean  Score Difference from Mean Critical Value Base Rate  Designs I 9 10.75 -1.75 2.38 >25%  Designs II 14 10.75 3.25 2.38 5-10%  Visual Reproduction I 14 10.75 3.25 1.86 5-10%  Visual Reproduction II 6 10.75 -4.75 1.48 2-5%  Statistical significance (critical value) at the .05 level.   Immediate Memory Index  Subtest Scaled Score IMI Mean Score Difference from Mean Critical Value Base Rate  Logical Memory I 11 11.00 0.00 2.59 >25%  [facility] I 10 11.00 -1.00 1.82 >25%  Designs I 9 11.00 -2.00 2.42 >25%  Visual Rep[facility] I 14 11.00 3.00 1.91 15%  Statistical significance (critical value) at the .05 level.   Delayed Memory Index  Subtest Scaled Score DMI Mean Score Difference from Mean Critical Value Base Rate  Logical Memory II 11 10.25 0.75 2.44 >25%  [facility] II 10 10.25 -0.25 2.44 >25%  Designs II 14 10.25 3.75 2.44 5-10%  Visual Reproduction II 6 10.25 -4.25 1.57 5%  Statistical significance (critical value) at the .05 level.    SUBTEST DISCREPANCY COMPARISON  Comparison Score 1 Score 2 Difference Critical Value Base Rate  Spatial Addition - Symbol Span 12 13 -1 2.74 85.9  Statistical significance (critical value) at the .05 level.    SUBTEST-LEVEL CONTRAST SCALED SCORES  Logical  Memory  Score Score 1 Score 2 Contrast Scaled Score  LM II Recognition vs. Delayed Recall 51-75% 11 10  LM Immediate Recall vs. Delayed Recall 11 11 11    [facility]  Score Score 1 Score 2 Contrast Scaled Score  VPA II Recognition vs. Delayed Recall 51-75% 10 9  VPA Immediate Recall vs. Delayed Recall 10 10 10    Designs  Score Score 1 Score 2 Contrast Scaled Score  DE I Spatial vs. Content 9 9 10   DE II Spatial vs. Content 13 13 11   DE II Recognition vs. Delayed Recall 51-75% 14 14  DE Immediate Recall vs. Delayed Recall 9 14 19    Visual Reproduction  Score Score 1 Score 2 Contrast Scaled Score  VR II Recognition vs. Delayed Recall >75% 6 4  VR Immediate Recall vs. Delayed Recall 14 6 2     INDEX-LEVEL CONTRAST SCALED SCORES  WMS-IV Indexes  Score Score 1 Score 2 Contrast Scaled Score  Auditory Memory Index vs. Visual Memory Index 102 104 10  Visual Working Memory Index vs. Visual Memory Index 115 104 8  Immediate Memory Index vs. Delayed Memory Index 107 102 8    RAW SCORES  Subtest Score Range Adult Score Range Older Adult Raw Score  Brief Cognitive Status Exam 0-58 0-58 52  Logical Memory I 0-50 0-53 28  Logical Memory II 0-50 0-39 24  [facility] I 0-56 0-40 28  [facility] II 0-14 0-10 9  CVLT-II Trials 1-5 5-95 5-95   CVLT-II Long-Delay -5-5 -5-5   Designs I 0-120  58  Designs II 0-120  64  Visual Reproduction I 0-43 0-43 40  Visual Reproduction II 0-43 0-43 9  Spatial Addition 0-24  13  Symbol Span 0-50 0-50 26  Process Score Range Adult Score Range Older Adult Raw Score  LM II Recognition 0-30 0-23 25  VPA II Recognition 0-40 0-30 38  VPA II Word Recall 0-28 0-20   DE I Content 0-48  32  DE I Spatial 0-24  14  DE II Content 0-48  37  DE II Spatial 0-24  15  DE II Recognition 0-24  14  VR II Recognition 0-7 0-7 7  VR II Copy 0-43 0-43     ABILITY-MEMORY ANALYSIS  Ability Score:  GAI: 117 Date of Testing:   WAIS-IV 2019/03/20; WMS-IV 2019/03/20  Predicted Difference Method   Index Predicted WMS-IV Index Score Actual WMS-IV Index Score Difference Critical Value  Significant Difference Y/N Base Rate  Auditory Memory 109 102 7 9.98 N   Visual Memory 110 104 6 9.30 N   Visual Working Memory 111 115 -4 11.60 N   Immediate Memory 111 107 4 10.99 N   Delayed Memory 110 102 8 11.44 N   Statistical significance (critical value) at the .01 level.   Contrast Scaled Scores  Score Score 1 Score 2 Contrast Scaled Score  General Ability Index vs. Auditory Memory Index 117 102 9  General Ability Index vs. Visual Memory Index 117 104 9  General Ability Index vs. Visual Working Memory Index 117 115 11  General Ability Index vs. Immediate Memory Index 117 107 10  General Ability Index vs. Delayed Memory Index 117 102 8  Verbal Comprehension Index vs. Auditory Memory Index 118 102 9  Perceptual Reasoning Index vs. Visual Memory Index 111 104 9  Perceptual Reasoning Index vs. Visual Working Memory Index 111 115 12  Working Memory Index vs. Auditory Memory Index 119 102 9  Working Memory Index vs. Visual Working Memory Index 119 115 11    End of Report

## 2019-04-21 NOTE — Progress Notes (Signed)
Patient referred by Deland Pretty, MD for chest tightness  Subjective:   Jacob Garrett, male    DOB: May 28, 1951, 68 y.o.   MRN: [id number]  I connected with the patient on 04/22/2019 by a telephone call and verified that I am speaking with the correct person using two identifiers.     I offered the patient a video enabled application for a virtual visit. Unfortunately, this could not be accomplished due to technical difficulties/lack of video enabled phone/computer. I discussed the limitations of evaluation and management by telemedicine and the availability of in person appointments. The patient expressed understanding and agreed to proceed.   This visit type was conducted due to national recommendations for restrictions regarding the COVID-19 Pandemic (e.g. social distancing).  This format is felt to be most appropriate for this patient at this time.  All issues noted in this document were discussed and addressed.  No physical exam was performed (except for noted visual exam findings with Tele health visits).  The patient has consented to conduct a Tele health visit and understands insurance will be billed.    Chief Complaint  Patient presents with  . pre- Syncope  . Follow-up    monitor and lab results    HPI  68 year old Caucasian male, former smoker, controlled hypertension, with exertional chest tightness and near syncope.  Workup showed structurally normal heart, except aneurysmal interatrial septum, without PFO. CTA showed moderate, FFR negative CAD on CCTA. No left main disease was noted. Event monitor did not reveal any arrhythmia.   Patient is doing well without any recurrence of presyncope/syncope episodes, or any angina symptoms. His blood pressure is running on the higher side, up 150/90 mmHg at times.   Past Medical History:  Diagnosis Date  . Anxiety   . Calcific tendinitis of left shoulder   . Cluster headache 1990  . Colon polyps   . Dupuytren's contracture of  both hands   . Elevated PSA   . Hypertriglyceridemia   . Inclusion cyst   . Inguinal hernia   . Pre-diabetes   . Thoracic nerve root impingement   . Thoracic spondylosis      Past Surgical History:  Procedure Laterality Date  . benign nodule removed from foot tendon    . CATARACT EXTRACTION, BILATERAL    . COLONOSCOPY W/ POLYPECTOMY     multiple  . Dupuytrens surgeries    . HERNIA REPAIR       Social History   Socioeconomic History  . Marital status: Married    Spouse name: Not on file  . Number of children: 3  . Years of education: Not on file  . Highest education level: Not on file  Occupational History  . Occupation: retired  Scientific laboratory technician  . Financial resource strain: Not on file  . Food insecurity    Worry: Not on file    Inability: Not on file  . Transportation needs    Medical: Not on file    Non-medical: Not on file  Tobacco Use  . Smoking status: Former Smoker    Packs/day: 1.00    Years: 25.00    Pack years: 25.00    Types: Cigarettes    Quit date: 05/26/1999    Years since quitting: 19.9  . Smokeless tobacco: Never Used  Substance and Sexual Activity  . Alcohol use: Not Currently  . Drug use: Yes    Types: Marijuana  . Sexual activity: Yes  Lifestyle  . Physical activity  Days per week: Not on file    Minutes per session: Not on file  . Stress: Not on file  Relationships  . Social Herbalist on phone: Not on file    Gets together: Not on file    Attends religious service: Not on file    Active member of club or organization: Not on file    Attends meetings of clubs or organizations: Not on file    Relationship status: Not on file  . Intimate partner violence    Fear of current or ex partner: Not on file    Emotionally abused: Not on file    Physically abused: Not on file    Forced sexual activity: Not on file  Other Topics Concern  . 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 Hypertension Mother   . Diabetes Mother   . Alcohol abuse Neg Hx   . Arthritis Neg Hx   . Cancer Neg Hx   . Early death Neg Hx   . Heart disease Neg Hx   . Hyperlipidemia Neg Hx   . Kidney disease Neg Hx   . Stroke Neg Hx   . Esophageal cancer Neg Hx   . Colon cancer Neg Hx      Current Outpatient Medications on File Prior to Visit  Medication Sig Dispense Refill  . amLODipine (NORVASC) 5 MG tablet Take 5 mg by mouth daily.    Marland Kitchen aspirin 81 MG chewable tablet Chew 81 mg by mouth daily.    . rosuvastatin (CRESTOR) 5 MG tablet Take 1 tablet (5 mg total) by mouth daily. 90 tablet 3  . tamsulosin (FLOMAX) 0.4 MG CAPS capsule Take 0.4 mg by mouth daily.    . valsartan (DIOVAN) 320 MG tablet Take 320 mg by mouth daily.     No current facility-administered medications on file prior to visit.     Cardiovascular studies:  Event monitor 02/23/2019 - 03/24/2019: Sinus rhythm. Rate 35-140 bpm. Sinus bradycardia at 35 bpm occurred on 7/22 at 11:35 PM, which may be normal. Two patient triggered events show sinus rhythm. No arrhythmia seen.  CTA 02/18/2019: Left Main:  No significant stenosis. FFR = 0.98 LAD: No significant stenosis. Proximal FFR = 0.94, Mid FFR =0.88, Distal FFR = 0.83, Diagonal branch proximal = 0.92, distal = 0.88 Ramus intermedius = proximal FFR = 0.97, distal = 0.95 LCX: No significant stenosis. Proximal FFR = 0.96, Distal FFR = 0.90 RCA: No significant stenosis. Proximal FFR = 0.96, Mid FFR =0.89, Distal FFR = 0.86  IMPRESSION: 1.  CT FFR analysis didn't show any significant stenosis.  Echocardiogram 02/09/2019: Normal LV systolic function with EF 57%. Left ventricle cavity is normal in size. Mild concentric hypertrophy of the left ventricle. Normal  global wall motion. Normal diastolic filling pattern. Calculated EF 57%. Left atrial cavity is normal in size. Aneurysmal interatrial septum without 2D or color Doppler evidence of interatrial shunt. Mild (Grade I) mitral regurgitation. Inadequate TR jet to estimate pulmonary artery systolic pressure. Normal right atrial pressure.   EKG 01/21/2019: Sinus rhythm 68 bpm. Normal EKG.   Recent labs: Results for FABRIZIO, FILIP (MRN [id number]) as of 04/22/2019 10:58  Ref. Range 02/24/2019 09:18  Total CHOL/HDL Ratio Latest Ref Range: 0.0 - 5.0 ratio 2.9  Cholesterol, Total Latest Ref Range: 100 - 199 mg/dL 168  HDL Cholesterol Latest Ref Range: >39 mg/dL 58  LDL (calc) Latest Ref Range: 0 - 99 mg/dL 79  Triglycerides Latest Ref Range: 0 - 149 mg/dL 155 (H)  VLDL Cholesterol Cal Latest Ref Range: 5 - 40 mg/dL 31   Glucose 129. BUN/Cr 20/0.9. eGFR 84. Na/K 140/3.7. Total protein 8.7 (6.4-8.2) Rest of the CMP normal H/H 15/43. MCV 86. Platelets 312.    Review of Systems  Constitution: Negative for decreased appetite, malaise/fatigue, weight gain and weight loss.  HENT: Negative for congestion.   Eyes: Negative for visual disturbance.  Cardiovascular: Positive for chest pain, dyspnea on exertion and near-syncope. Negative for leg swelling, palpitations and syncope.  Respiratory: Negative for cough.   Endocrine: Negative for cold intolerance.  Hematologic/Lymphatic: Does not bruise/bleed easily.  Skin: Negative for itching and rash.  Musculoskeletal: Negative for myalgias.  Gastrointestinal: Negative for abdominal pain, nausea and vomiting.  Genitourinary: Negative for dysuria.  Neurological: Negative for dizziness and weakness.  Psychiatric/Behavioral: The patient is not nervous/anxious.   All other systems reviewed and are negative.        Vitals:   04/22/19 1057  BP: 136/86  Pulse: 64     Objective:   Physical Exam  Not performed. Telephone visit.        Assessment &  Recommendations:   68 year old Caucasian male, former smoker, controlled hypertension, with exertional chest tightness and near syncope.  Pre-syncope: Nonobstructive CAD, normal EF. No structural reason found for his symptoms. No arhythmia seen on event monitor. I suspect his episodes are likely related to orthostatic hypotension or vasovagal syncope. Recommend liberal hydration, counterpressure maneuvers explained to the patient. Also recommend wearing compression stockings.  Nonobstructive CAD: Recommend Aspirin 81 mg, Crestor 5 mg.   Hypertension: Suboptimal control. Resume amlodipine at 2.5 mg daily. F?u telephoen visit in 4 weeks. If tolerates well, can go up to 5 mg daily. Once blood pressure stabilizes, I will see him on annual basis.    Nigel Mormon, MD [facility] Pager: [phone] Office: [phone] If no answer Cell [phone]

## 2019-04-22 ENCOUNTER — Encounter: Payer: Self-pay | Admitting: Cardiology

## 2019-04-22 ENCOUNTER — Other Ambulatory Visit: Payer: Self-pay

## 2019-04-22 ENCOUNTER — Telehealth (INDEPENDENT_AMBULATORY_CARE_PROVIDER_SITE_OTHER): Payer: Medicare Other | Admitting: Cardiology

## 2019-04-22 VITALS — BP 136/86 | HR 64

## 2019-04-22 DIAGNOSIS — I2510 Atherosclerotic heart disease of native coronary artery without angina pectoris: Secondary | ICD-10-CM | POA: Diagnosis not present

## 2019-04-22 DIAGNOSIS — R55 Syncope and collapse: Secondary | ICD-10-CM

## 2019-04-22 DIAGNOSIS — I10 Essential (primary) hypertension: Secondary | ICD-10-CM

## 2019-04-22 MED ORDER — AMLODIPINE BESYLATE 2.5 MG PO TABS: 2.5000 mg | ORAL_TABLET | Freq: Every day | ORAL | 0 refills | Status: DC

## 2019-04-28 ENCOUNTER — Other Ambulatory Visit: Payer: Self-pay

## 2019-04-28 ENCOUNTER — Encounter: Payer: Medicare Other | Attending: Psychology | Admitting: Psychology

## 2019-04-28 ENCOUNTER — Encounter: Payer: Self-pay | Admitting: Psychology

## 2019-04-28 DIAGNOSIS — Z79899 Other long term (current) drug therapy: Secondary | ICD-10-CM | POA: Insufficient documentation

## 2019-04-28 DIAGNOSIS — F4000 Agoraphobia, unspecified: Secondary | ICD-10-CM | POA: Diagnosis not present

## 2019-04-28 DIAGNOSIS — R413 Other amnesia: Secondary | ICD-10-CM | POA: Diagnosis not present

## 2019-04-28 DIAGNOSIS — G479 Sleep disorder, unspecified: Secondary | ICD-10-CM | POA: Diagnosis not present

## 2019-04-28 NOTE — Progress Notes (Signed)
Today I provided feedback regarding the results of the recent neuropsychological evaluation we went through the results and explained each aspect of it to the patient.  The patient is actually doing well relative to his age-related comparison group and there were no indications of any degenerative dementia present in this testing.  I gave him recommendations around healthy diet, exercise and sleep patterns and some brief description of ways that he can cope better with some things that are causing some degree of anxiety.  Again, there were no indications of any progressive dementias.  We now have a good baseline measure if anything does develop in the future that warrants repeat testing.  Below is a summary and interpretation from the formal evaluation that can be found on 04/21/2019.  Today's visit was a 1 hour in person visit that was conducted with the patient and myself in my outpatient clinic office.  Summary of Results:                        Overall, the results of the current neuropsychological evaluation are not consistent with any particular findings of neuropsychological deficits or cognitive dysfunction.  The patient produced a general abilities index score in the high average range that was consistent with his full-scale IQ score which was also in the high average range.  Both of these measures would be consistent with predicted levels based on his educational and occupational history.  The patient strengths lie with regard to his auditory and visual encoding abilities which are the prerequisite and starting point for various memory abilities.  The patient also showed relative strengths both to himself as well as to a normative population with regard to his verbal comprehension abilities.  The patient showed good visual spatial abilities and visual analysis and organizational abilities.  While relatively speaking his processing speed was lower than his average overall cognitive functioning all of the  measures assessing information processing speed, cognitive flexibility, and shifting/sequencing abilities were in the average to high average range.  While generally speaking, the patient's memory functions were slightly below predicted levels based on his general abilities index score and his educational history there were still within normal limits and there were no clear patterns of deficits.  The patient's visual and auditory memory are both performing consistent with each other.  The patient showed good ability to initially learn information and was able to effectively organize, store and retrieve that information after period of delay.  There were no objective findings of any memory deficits per se.  Impression/Diagnosis:                     Overall, the results of the current neuropsychological evaluation are not consistent with any consistent pattern of any neuropsychological or cognitive deficits.  While the patient did have some mild decrease in memory functions relative to predicted levels these were still not statistically significant and there were no indications of any attentional/encoding deficits, visual-spatial deficits or attentional deficits noted.  While the patient does describe some expressive language issues related to word finding which could be related to some early stages of microvascular ischemia/small vessel disease other measures such as information processing speed and cognitive shifting were all quite good and therefore there is no consistent pattern of small vessel disease types of symptoms on his cognitive testing.  He does have a long history of dealing with high blood pressure issues and going forward he should closely monitor this  and follow his cardiologist recommendations regarding managing his high blood pressure consistently.  The patient also has had some recent issues with sleep disturbance following started on various blood pressure medicines and this could create some  acute/functional memory difficulties and may be the primary culprit potentially along with some anxiety symptoms creating some of the subjective symptoms of memory weaknesses.  However, there does not appear to be any consistent findings of any neuropsychological deficits.  We now have very strong baseline measures for the patient going forward if there are any progressive changes that are noted.  Initially, the first evaluations are utilizing comparisons to normative populations and now with his baseline we will be able to make direct comparisons between the patient's current functioning with any future assessments if warranted.  Diagnosis:                                Memory loss   Ilean Skill, Psy.D. Neuropsychologist

## 2019-05-13 ENCOUNTER — Other Ambulatory Visit: Payer: Self-pay

## 2019-05-13 DIAGNOSIS — I10 Essential (primary) hypertension: Secondary | ICD-10-CM

## 2019-05-13 MED ORDER — AMLODIPINE BESYLATE 2.5 MG PO TABS: 2.5000 mg | ORAL_TABLET | Freq: Every day | ORAL | 0 refills | Status: DC

## 2019-05-14 ENCOUNTER — Other Ambulatory Visit: Payer: Self-pay

## 2019-05-14 DIAGNOSIS — I10 Essential (primary) hypertension: Secondary | ICD-10-CM

## 2019-05-14 MED ORDER — AMLODIPINE BESYLATE 2.5 MG PO TABS: 2.5000 mg | ORAL_TABLET | Freq: Every day | ORAL | 0 refills | Status: DC

## 2019-05-15 ENCOUNTER — Other Ambulatory Visit: Payer: Self-pay

## 2019-06-15 ENCOUNTER — Telehealth: Payer: Self-pay

## 2019-08-12 NOTE — Progress Notes (Signed)
Patient referred by Deland Pretty, MD for chest tightness  Subjective:   Jacob Garrett, male    DOB: [birth year], 68 y.o.   MRN: [id number]   Chief Complaint  Patient presents with  . presyncope  . Hypertension  . Follow-up    HPI  68 year old Caucasian male with hypertension, nonobstructive CAD.  Patient has not had any episodes of presyncope. CTA in 01/2019 showed mild nonobstructive CAD. His blood pressure is normal in the afternoon but high (140/80s) in the morning. Her takes valsartan 320 mg in am, and amlodipine 2.5 mg in pm. He is currently not taking crestor and wants to avoid, if possible.   He has his annual physical with labs with Dr. Sharlett Iles, next month.   Past Medical History:  Diagnosis Date  . Anxiety   . Calcific tendinitis of left shoulder   . Cluster headache 1990  . Colon polyps   . Dupuytren's contracture of both hands   . Elevated PSA   . Hypertriglyceridemia   . Inclusion cyst   . Inguinal hernia   . Pre-diabetes   . Thoracic nerve root impingement   . Thoracic spondylosis      Past Surgical History:  Procedure Laterality Date  . benign nodule removed from foot tendon    . CATARACT EXTRACTION, BILATERAL    . COLONOSCOPY W/ POLYPECTOMY     multiple  . Dupuytrens surgeries    . HERNIA REPAIR       Social History   Socioeconomic History  . Marital status: Married    Spouse name: Not on file  . Number of children: 3  . Years of education: Not on file  . Highest education level: Not on file  Occupational History  . Occupation: retired  Tobacco Use  . Smoking status: Former Smoker    Packs/day: 1.00    Years: 25.00    Pack years: 25.00    Types: Cigarettes    Quit date: 05/26/1999    Years since quitting: 20.2  . Smokeless tobacco: Never Used  Substance and Sexual Activity  . Alcohol use: Not Currently  . Drug use: Yes    Types: Marijuana  . Sexual activity: Yes  Other Topics Concern  . 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 Difficulty of Paying Living Expenses: Not on file  Food Insecurity:   . Worried About Charity fundraiser in the Last Year: Not on file  . Ran Out of Food in the Last Year: Not on file  Transportation Needs:   . Lack of Transportation (Medical): Not on file  . Lack of Transportation (Non-Medical): Not on file  Physical Activity:   . Days of Exercise per Week: Not on file  . Minutes of Exercise per Session: Not on file  Stress:   . Feeling of Stress : Not on file  Social Connections:   . Frequency of Communication with Friends and Family: Not on file  . Frequency of Social Gatherings with Friends and Family: Not on file  . Attends Religious Services: Not on file  . Active Member of Clubs or Organizations: Not on file  . Attends Archivist Meetings: Not on file  .  Marital Status: Not on file  Intimate Partner Violence:   . Fear of Current or Ex-Partner: Not on file  . Emotionally Abused: Not on file  . Physically Abused: Not on file  . Sexually Abused: Not on file     Family History  Problem Relation Age of Onset  . Hypertension Mother   . Diabetes Mother   . Alcohol abuse Neg Hx   . Arthritis Neg Hx   . Cancer Neg Hx   . Early death Neg Hx   . Heart disease Neg Hx   . Hyperlipidemia Neg Hx   . Kidney disease Neg Hx   . Stroke Neg Hx   . Esophageal cancer Neg Hx   . Colon cancer Neg Hx      Current Outpatient Medications on File Prior to Visit  Medication Sig Dispense Refill  . amLODipine (NORVASC) 2.5 MG tablet Take 1 tablet (2.5 mg total) by mouth daily. 180 tablet 0  . aspirin 81 MG chewable tablet Chew 81 mg by mouth daily.    . tamsulosin (FLOMAX) 0.4 MG CAPS capsule Take  0.4 mg by mouth daily.    . valsartan (DIOVAN) 320 MG tablet Take 320 mg by mouth daily.     No current facility-administered medications on file prior to visit.    Cardiovascular studies:  EKG 08/14/2019: Sinus rhythm 67 bpm. Normal EKG.   Event monitor 02/23/2019 - 03/24/2019: Sinus rhythm. Rate 35-140 bpm. Sinus bradycardia at 35 bpm occurred on 7/22 at 11:35 PM, which may be normal. Two patient triggered events show sinus rhythm. No arrhythmia seen.  CTA 02/18/2019: Left Main:  No significant stenosis. FFR = 0.98 LAD: No significant stenosis. Proximal FFR = 0.94, Mid FFR =0.88, Distal FFR = 0.83, Diagonal branch proximal = 0.92, distal = 0.88 Ramus intermedius = proximal FFR = 0.97, distal = 0.95 LCX: No significant stenosis. Proximal FFR = 0.96, Distal FFR = 0.90 RCA: No significant stenosis. Proximal FFR = 0.96, Mid FFR =0.89, Distal FFR = 0.86  IMPRESSION: 1.  CT FFR analysis didn't show any significant stenosis.  Echocardiogram 02/09/2019: Normal LV systolic function with EF 57%. Left ventricle cavity is normal in size. Mild concentric hypertrophy of the left ventricle. Normal global wall motion. Normal diastolic filling pattern. Calculated EF 57%. Left atrial cavity is normal in size. Aneurysmal interatrial septum without 2D or color Doppler evidence of interatrial shunt. Mild (Grade I) mitral regurgitation. Inadequate TR jet to estimate pulmonary artery systolic pressure. Normal right atrial pressure.    Recent labs: 01/28/2019: Glucose 129. BUN/Cr 20/0.9. eGFR 84. Na/K 140/3.7. Total protein 8.7 (6.4-8.2) Rest of the CMP normal H/H 15/43. MCV 86. Platelets 312.  Chol 217, TG 155, HDL 58, LDL 79  Review of Systems  Constitution: Negative for decreased appetite, malaise/fatigue, weight gain and weight loss.  HENT: Negative for congestion.   Eyes: Negative for visual disturbance.  Cardiovascular: Negative for chest pain, dyspnea on exertion, leg swelling,  near-syncope, palpitations and syncope.  Respiratory: Negative for cough.   Endocrine: Negative for cold intolerance.  Hematologic/Lymphatic: Does not bruise/bleed easily.  Skin: Negative for itching and rash.  Musculoskeletal: Negative for myalgias.  Gastrointestinal: Negative for abdominal pain, nausea and vomiting.  Genitourinary: Negative for dysuria.  Neurological: Negative for dizziness and weakness.  Psychiatric/Behavioral: The patient is not nervous/anxious.   All other systems reviewed and are negative.        Vitals:   08/14/19 0931 08/14/19 0938  BP: (!) 141/89 121/90  Pulse:  75 72  Temp: 98.4 F (36.9 C)   SpO2: 96%      Objective:   Physical Exam  Constitutional: He is oriented to person, place, and time. He appears well-developed and well-nourished. No distress.  HENT:  Head: Normocephalic and atraumatic.  Eyes: Pupils are equal, round, and reactive to light. Conjunctivae are normal.  Neck: No JVD present.  Cardiovascular: Normal rate, regular rhythm and intact distal pulses.  Pulmonary/Chest: Effort normal and breath sounds normal. He has no wheezes. He has no rales.  Abdominal: Soft. Bowel sounds are normal. There is no rebound.  Musculoskeletal:        General: No edema.  Lymphadenopathy:    He has no cervical adenopathy.  Neurological: He is alert and oriented to person, place, and time. No cranial nerve deficit.  Skin: Skin is warm and dry.  Psychiatric: He has a normal mood and affect.  Nursing note and vitals reviewed.          Assessment & Recommendations:   68 year old Caucasian male, former smoker, controlled hypertension, nonobstructive CAD.  Nonobstructive CAD: Continue aspirin 81 mg. He is reluctant to take statin. LDL 79, HDL 58. Upcoming lipid panel check with PCP Dr. Sharlett Iles next month, May be okay to skip Crestor if lipid panel stays stable.   Hypertension: Normal in PM,m elevated in AM. Increase evening amlodipine to 5 mg  daily. Take 2 pills of 2.5 mg for now. If toeratesm he will request Dr. Sharlett Iles to give 5 mg prescription.  F/u in 1 year . Nigel Mormon, MD [facility] [place] Pager: [phone] Office: [phone] If no answer Cell [phone]

## 2019-08-14 ENCOUNTER — Encounter: Payer: Self-pay | Admitting: Cardiology

## 2019-08-14 ENCOUNTER — Other Ambulatory Visit: Payer: Self-pay

## 2019-08-14 ENCOUNTER — Ambulatory Visit (INDEPENDENT_AMBULATORY_CARE_PROVIDER_SITE_OTHER): Payer: Medicare Other | Admitting: Cardiology

## 2019-08-14 VITALS — BP 121/90 | HR 72 | Temp 98.4°F | Ht 70.0 in | Wt 183.7 lb

## 2019-08-14 DIAGNOSIS — I2510 Atherosclerotic heart disease of native coronary artery without angina pectoris: Secondary | ICD-10-CM

## 2019-08-14 DIAGNOSIS — I10 Essential (primary) hypertension: Secondary | ICD-10-CM | POA: Diagnosis not present

## 2019-09-30 IMAGING — CT CT HEAR MORPH WITH CTA COR WITH SCORE WITH CA WITH CONTRAST AND
4 of 7 series · 8 of 20 positions shown, 9 images · IV contrast (omnipaque)
Comparison: None.
COMPARISON: None.

Addendum:
EXAM:
OVER-READ INTERPRETATION  CT CHEST

The following report is an over-read performed by radiologist Dr.
Enrike Gasol [REDACTED] on 02/18/2019. This
over-read does not include interpretation of cardiac or coronary
anatomy or pathology. The coronary calcium score/coronary CTA
interpretation by the cardiologist is attached.
HISTORY: Chest pain, concern for LM disease
Cardiac/Coronary  CT
TECHNIQUE: The patient was scanned on a Siemens Force scanner.
PROTOCOL: A 120 kV prospective scan was triggered in the descending thoracic
aorta at 111 HU's. Axial non-contrast 3 mm slices were carried out
through the heart. The data set was analyzed on a dedicated work
station and scored using the Agatson method. Gantry rotation speed
was 250 msecs and collimation was .6 mm. Beta blockade and 0.8 mg of
sl NTG was given. The 3D data set was reconstructed in 5% intervals
of the 67-82 % of the R-R cycle. Diastolic phases were analyzed on a
dedicated work station using MPR, MIP and VRT modes. The patient
received 100mL OMNIPAQUE IOHEXOL 350 MG/ML SOLN of contrast.

[Series 6: best diast 75 % · axial · 0.39mm/px · z∈[+1146,+1197]mm · 2 of 382 slices shown, 3 images]
[im 128/382  vessel]
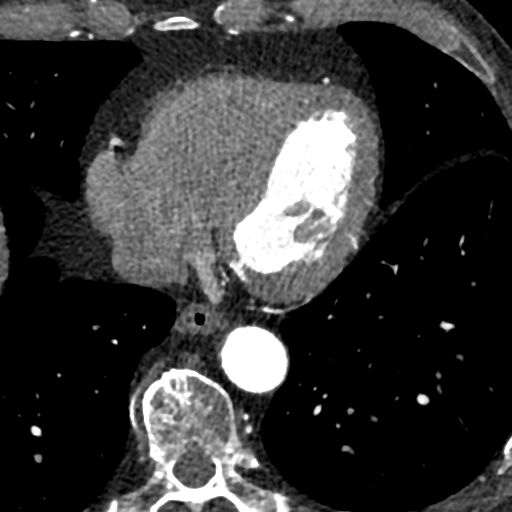
[im 128/382  lung]
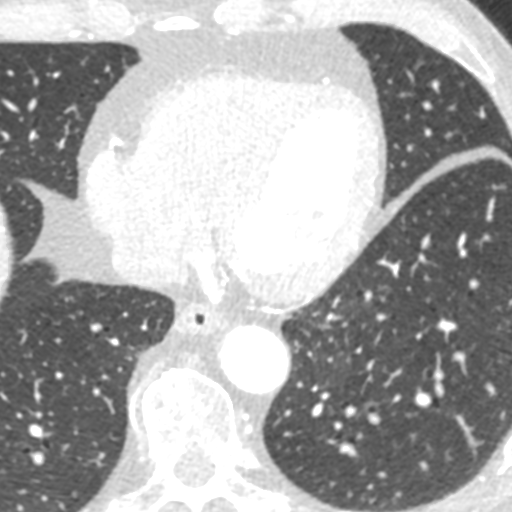
[im 255/382  vessel]
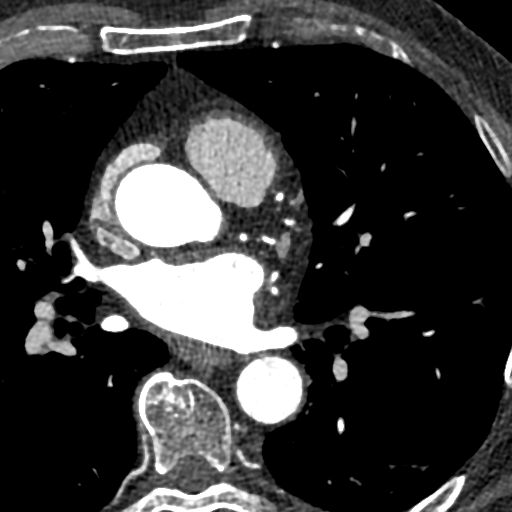

[Series 7: best syst 37 % · axial · 0.39mm/px · z∈[+1146,+1197]mm · 2 of 382 slices shown]
[im 128/382  vessel]
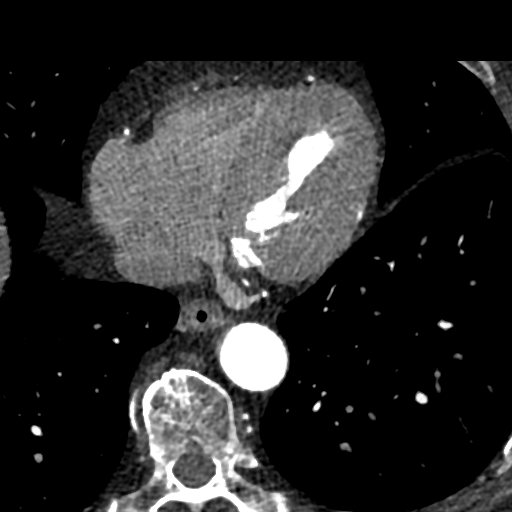
[im 255/382  vessel]
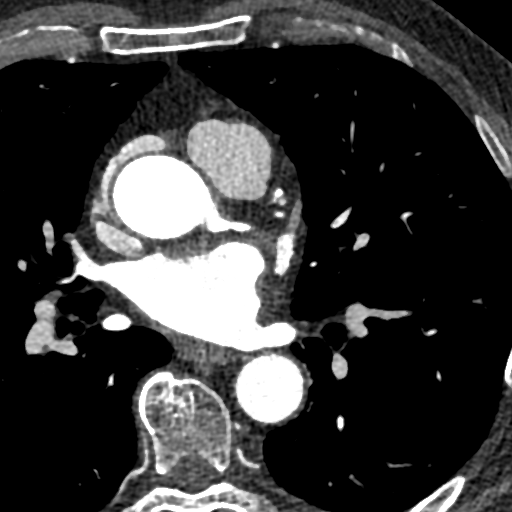

[Series 8: ts diast sharp 75 % · axial · 0.39mm/px · z∈[+1146,+1197]mm · 2 of 382 slices shown]
[im 128/382  lung]
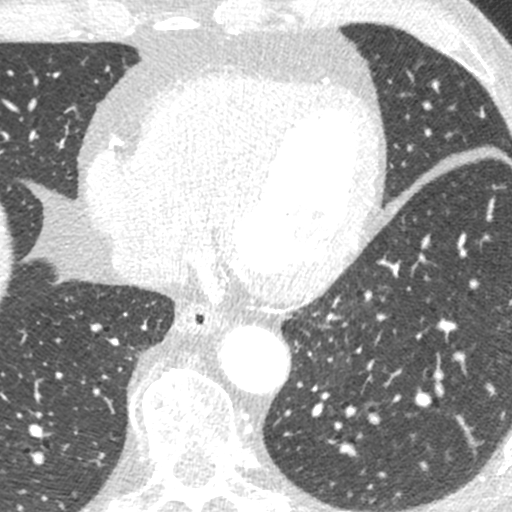
[im 255/382  lung]
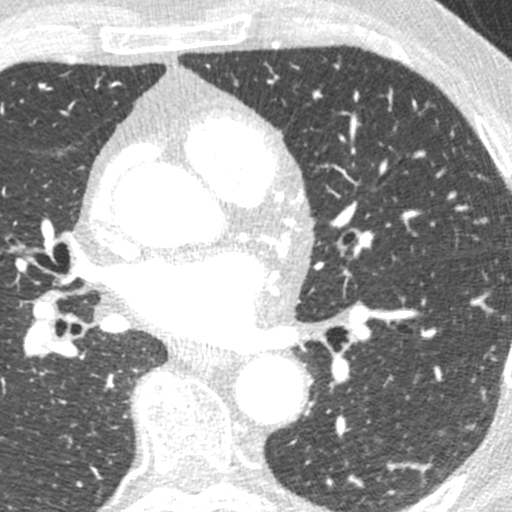

[Series 9: ts syst sharp 37 % · axial · 0.39mm/px · z∈[+1146,+1197]mm · 2 of 382 slices shown]
[im 128/382  lung]
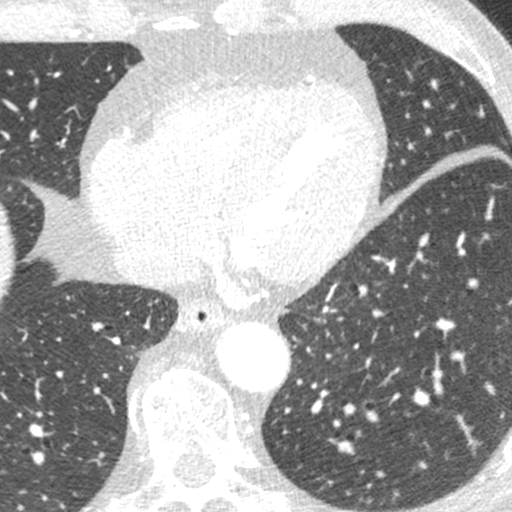
[im 255/382  lung]
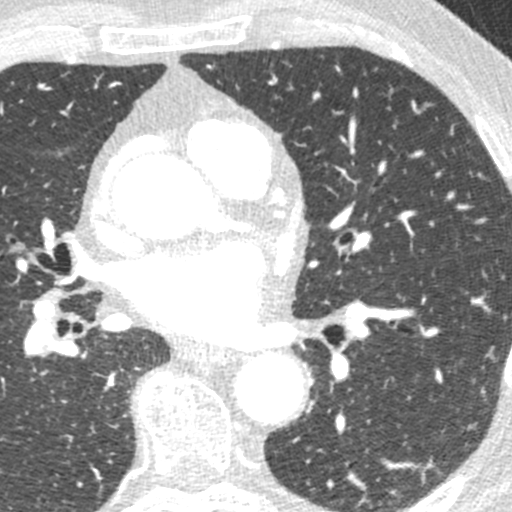

[8 of 20 positions shown; findings below may reference images not displayed]

FINDINGS: Aortic atherosclerosis. Within the visualized portions of the thorax
there are no suspicious appearing pulmonary nodules or masses, there
is no acute consolidative airspace disease, no pleural effusions, no
pneumothorax and no lymphadenopathy. Visualized portions of the
upper abdomen are unremarkable. There are no aggressive appearing
lytic or blastic lesions noted in the visualized portions of the
skeleton.
IMPRESSION: 1.  Aortic Atherosclerosis (K67CA-7Y3.3).
FINDINGS: Coronary calcium score: The patient's coronary artery calcium score
is 110, which places the patient in the 51st percentile, average for
what is expected compared to age and sex matched peers.
Calcifications in the LAD, circumflex, and right coronary artery.

Coronary arteries: Normal coronary origins.  Right dominance.

Right Coronary Artery: Mild mixed atherosclerotic plaque in the
proximal RCA (25-49% stenosis). Mild tubular atherosclerotic plaque
in the mid RCA (25-49% stenosis). Mild mixed atherosclerotic plaque
in the mid-distal RCA. It is not well visualized in any phase of
cardiac cycle, but does not appear to be more than 25-49% stenosed.

Left Main Coronary Artery: No detectable plaque or stenosis. See
series 0222 for representative images.

Left Anterior Descending Coronary Artery: Moderate mixed
atherosclerotic plaque in the proximal-mid LAD at the takeoff of the
first diagonal, 50-69% stenosis. This lesion extends into mid LAD.
Remainder of vessel is patent without significant plaque or
stenosis.

Ramus intermedius branch vs. High OM branch - moderate mixed
atherosclerotic plaque in the proximal portion of this small caliber
vessel (50-69% stenosis).

Left Circumflex Artery: Minimal scattered plaque, <25% stenosis.

Aorta: Sinuses of Valsalva measure 39 mm R-L, L-Non, R-Non coronary
cusps, 33 mm at the mid ascending aorta (level of the PA
bifurcation) measured double oblique. Trivial calcifications. No
dissection.

Aortic Valve: Trivial annular calcifications.

Other findings:

Normal pulmonary vein drainage into the left atrium.

Normal left atrial appendage without a thrombus.

Normal size of the pulmonary artery.

Lipomatous hypertrophy of the atrial septum.

Normal pericardium.
IMPRESSION: 1. Moderate CAD, CADRADS = 3. Mixed calcified plaque in the
proximal-mid LAD with moderate stenosis, 50-69% stenosis, and
moderate mixed atherosclerotic plaque in the ramus intermedius small
caliber vessel 50-69% stenosis.

CT FFR analysis will be performed and reported separately. No left
main coronary artery disease noted, see series 0222 for
representative images.

2. The patient's coronary artery calcium score is 110, which places
the patient in the 51st percentile, average for what is expected
compared to age and sex matched peers.

3. Normal coronary origin with right dominance.

*** End of Addendum ***
EXAM:
OVER-READ INTERPRETATION  CT CHEST

The following report is an over-read performed by radiologist Dr.
Enrike Gasol [REDACTED] on 02/18/2019. This
over-read does not include interpretation of cardiac or coronary
anatomy or pathology. The coronary calcium score/coronary CTA
interpretation by the cardiologist is attached.
FINDINGS: Aortic atherosclerosis. Within the visualized portions of the thorax
there are no suspicious appearing pulmonary nodules or masses, there
is no acute consolidative airspace disease, no pleural effusions, no
pneumothorax and no lymphadenopathy. Visualized portions of the
upper abdomen are unremarkable. There are no aggressive appearing
lytic or blastic lesions noted in the visualized portions of the
skeleton.
IMPRESSION: 1.  Aortic Atherosclerosis (K67CA-7Y3.3).

## 2019-10-21 DIAGNOSIS — Z125 Encounter for screening for malignant neoplasm of prostate: Secondary | ICD-10-CM | POA: Diagnosis not present

## 2019-10-21 DIAGNOSIS — R809 Proteinuria, unspecified: Secondary | ICD-10-CM | POA: Diagnosis not present

## 2019-10-21 DIAGNOSIS — I10 Essential (primary) hypertension: Secondary | ICD-10-CM | POA: Diagnosis not present

## 2019-10-28 DIAGNOSIS — N401 Enlarged prostate with lower urinary tract symptoms: Secondary | ICD-10-CM | POA: Diagnosis not present

## 2019-10-28 DIAGNOSIS — R7303 Prediabetes: Secondary | ICD-10-CM | POA: Diagnosis not present

## 2019-10-28 DIAGNOSIS — G2581 Restless legs syndrome: Secondary | ICD-10-CM | POA: Diagnosis not present

## 2019-10-28 DIAGNOSIS — Z0000 Encounter for general adult medical examination without abnormal findings: Secondary | ICD-10-CM | POA: Diagnosis not present

## 2019-10-28 DIAGNOSIS — G250 Essential tremor: Secondary | ICD-10-CM | POA: Diagnosis not present

## 2019-10-28 DIAGNOSIS — R05 Cough: Secondary | ICD-10-CM | POA: Diagnosis not present

## 2019-10-28 DIAGNOSIS — E8809 Other disorders of plasma-protein metabolism, not elsewhere classified: Secondary | ICD-10-CM | POA: Diagnosis not present

## 2019-10-28 DIAGNOSIS — I10 Essential (primary) hypertension: Secondary | ICD-10-CM | POA: Diagnosis not present

## 2019-11-02 ENCOUNTER — Other Ambulatory Visit: Payer: Self-pay | Admitting: Internal Medicine

## 2019-11-02 DIAGNOSIS — N529 Male erectile dysfunction, unspecified: Secondary | ICD-10-CM | POA: Diagnosis not present

## 2019-11-02 DIAGNOSIS — R918 Other nonspecific abnormal finding of lung field: Secondary | ICD-10-CM

## 2019-11-02 DIAGNOSIS — N411 Chronic prostatitis: Secondary | ICD-10-CM | POA: Diagnosis not present

## 2019-11-02 DIAGNOSIS — N401 Enlarged prostate with lower urinary tract symptoms: Secondary | ICD-10-CM | POA: Diagnosis not present

## 2019-11-02 DIAGNOSIS — R972 Elevated prostate specific antigen [PSA]: Secondary | ICD-10-CM | POA: Diagnosis not present

## 2019-11-04 ENCOUNTER — Ambulatory Visit
Admission: RE | Admit: 2019-11-04 | Discharge: 2019-11-04 | Disposition: A | Discharge status: Home / Self Care | Payer: Medicare Other | Source: Ambulatory Visit | Attending: Internal Medicine | Admitting: Internal Medicine

## 2019-11-04 ENCOUNTER — Other Ambulatory Visit: Payer: Self-pay

## 2019-11-04 DIAGNOSIS — R911 Solitary pulmonary nodule: Secondary | ICD-10-CM | POA: Diagnosis not present

## 2019-11-04 DIAGNOSIS — R918 Other nonspecific abnormal finding of lung field: Secondary | ICD-10-CM

## 2019-11-05 ENCOUNTER — Other Ambulatory Visit: Payer: Self-pay | Admitting: Cardiology

## 2019-11-05 DIAGNOSIS — I10 Essential (primary) hypertension: Secondary | ICD-10-CM

## 2019-11-19 ENCOUNTER — Telehealth: Payer: Self-pay | Admitting: Internal Medicine

## 2019-11-19 NOTE — Telephone Encounter (Signed)
Dr. Carlean Purl, this pt saw you 03/2018.  He had his previous colonoscopy report sent for review--records will be sent to you.  Please advise recall.

## 2019-11-20 ENCOUNTER — Encounter: Payer: Self-pay | Admitting: Internal Medicine

## 2019-11-20 NOTE — Telephone Encounter (Signed)
Hx adenomas 2007 and ssp's 2014  Appropriate to schedule colonoscopy now

## 2019-11-25 ENCOUNTER — Other Ambulatory Visit: Payer: Self-pay | Admitting: Internal Medicine

## 2019-11-25 DIAGNOSIS — Z09 Encounter for follow-up examination after completed treatment for conditions other than malignant neoplasm: Secondary | ICD-10-CM

## 2019-12-22 ENCOUNTER — Other Ambulatory Visit: Payer: Medicare Other

## 2019-12-28 ENCOUNTER — Ambulatory Visit (AMBULATORY_SURGERY_CENTER): Payer: Self-pay | Admitting: *Deleted

## 2019-12-28 ENCOUNTER — Other Ambulatory Visit: Payer: Self-pay

## 2019-12-28 VITALS — Temp 97.3°F | Ht 69.0 in | Wt 184.0 lb

## 2019-12-28 DIAGNOSIS — Z86010 Personal history of colonic polyps: Secondary | ICD-10-CM

## 2019-12-28 NOTE — Progress Notes (Signed)
Patient is here in-person for PV. Patient denies any allergies to eggs or soy. Patient denies any problems with anesthesia/sedation. Patient denies any oxygen use at home. Patient denies taking any diet/weight loss medications or blood thinners. Patient is not being treated for MRSA or C-diff. Patient denies any constipation. Patient is aware of our care-partner policy and 0000000 safety protocol. EMMI education assisgned to the patient for the procedure, this was explained and instructions given to patient.   Completed vaccines covid on 11/04/19.

## 2020-01-11 ENCOUNTER — Other Ambulatory Visit: Payer: Self-pay

## 2020-01-11 ENCOUNTER — Encounter: Payer: Self-pay | Admitting: Internal Medicine

## 2020-01-11 ENCOUNTER — Ambulatory Visit (AMBULATORY_SURGERY_CENTER): Payer: Medicare Other | Admitting: Internal Medicine

## 2020-01-11 VITALS — BP 123/72 | HR 56 | Temp 97.1°F | Resp 13 | Ht 69.0 in | Wt 184.0 lb

## 2020-01-11 DIAGNOSIS — D122 Benign neoplasm of ascending colon: Secondary | ICD-10-CM

## 2020-01-11 DIAGNOSIS — Z86010 Personal history of colonic polyps: Secondary | ICD-10-CM

## 2020-01-11 DIAGNOSIS — Z1211 Encounter for screening for malignant neoplasm of colon: Secondary | ICD-10-CM | POA: Diagnosis not present

## 2020-01-11 MED ORDER — SODIUM CHLORIDE 0.9 % IV SOLN: 500.0000 mL | Freq: Once | INTRAVENOUS | Status: DC

## 2020-01-11 NOTE — Progress Notes (Signed)
Pt's states no medical or surgical changes since previsit or office visit.  KA vitals, SH IV and LC temp.

## 2020-01-11 NOTE — Progress Notes (Signed)
To PACU, VSS. Report to [email or web address] 

## 2020-01-11 NOTE — Progress Notes (Signed)
Called to room to assist during endoscopic procedure.  Patient ID and intended procedure confirmed with present staff. Received instructions for my participation in the procedure from the performing physician.  

## 2020-01-11 NOTE — Patient Instructions (Addendum)
I found and removed 2 tiny polyps today. I will let you know pathology results and when to have another routine colonoscopy by mail and/or My Chart.   I appreciate the opportunity to care for you. Cliffton Spradley E. Arlean Thies, MD, FACG  YOU HAD AN ENDOSCOPIC PROCEDURE TODAY AT THE [facility]:   Refer to the procedure report that was given to you for any specific questions about what was found during the examination.  If the procedure report does not answer your questions, please call your gastroenterologist to clarify.  If you requested that your care partner not be given the details of your procedure findings, then the procedure report has been included in a sealed envelope for you to review at your convenience later.  YOU SHOULD EXPECT: Some feelings of bloating in the abdomen. Passage of more gas than usual.  Walking can help get rid of the air that was put into your GI tract during the procedure and reduce the bloating. If you had a lower endoscopy (such as a colonoscopy or flexible sigmoidoscopy) you may notice spotting of blood in your stool or on the toilet paper. If you underwent a bowel prep for your procedure, you may not have a normal bowel movement for a few days.  Please Note:  You might notice some irritation and congestion in your nose or some drainage.  This is from the oxygen used during your procedure.  There is no need for concern and it should clear up in a day or so.  SYMPTOMS TO REPORT IMMEDIATELY:   Following lower endoscopy (colonoscopy or flexible sigmoidoscopy):  Excessive amounts of blood in the stool  Significant tenderness or worsening of abdominal pains  Swelling of the abdomen that is new, acute  Fever of 100F or higher  For urgent or emergent issues, a gastroenterologist can be reached at any hour by calling [phone]. Do not use MyChart messaging for urgent concerns.    DIET:  We do recommend a small meal at first, but then you may proceed to your  regular diet.  Drink plenty of fluids but you should avoid alcoholic beverages for 24 hours.  ACTIVITY:  You should plan to take it easy for the rest of today and you should NOT DRIVE or use heavy machinery until tomorrow (because of the sedation medicines used during the test).    FOLLOW UP: Our staff will call the number listed on your records 48-72 hours following your procedure to check on you and address any questions or concerns that you may have regarding the information given to you following your procedure. If we do not reach you, we will leave a message.  We will attempt to reach you two times.  During this call, we will ask if you have developed any symptoms of COVID 19. If you develop any symptoms (ie: fever, flu-like symptoms, shortness of breath, cough etc.) before then, please call [phone].  If you test positive for Covid 19 in the 2 weeks post procedure, please call and report this information to us.    If any biopsies were taken you will be contacted by phone or by letter within the next 1-3 weeks.  Please call us at [phone] if you have not heard about the biopsies in 3 weeks.    SIGNATURES/CONFIDENTIALITY: You and/or your care partner have signed paperwork which will be entered into your electronic medical record.  These signatures attest to the fact that that the information above on your After   on your After Visit Summary has been reviewed and is understood.  Full responsibility of the confidentiality of this discharge information lies with you and/or your care-partner.

## 2020-01-11 NOTE — Op Note (Signed)
[name] Patient Name: Jacob Garrett Procedure Date: 01/11/2020 8:27 AM MRN: [id number] Endoscopist: Gatha Mayer , MD Age: 69 Referring MD:  Date of Birth: March 20, 1951 Gender: Male Account #: [id number] Procedure:                Colonoscopy Indications:              Surveillance: Personal history of adenomatous                            polyps on last colonoscopy > 5 years ago Medicines:                Propofol per Anesthesia, Monitored Anesthesia Care Procedure:                Pre-Anesthesia Assessment:                           - Prior to the procedure, a History and Physical                            was performed, and patient medications and                            allergies were reviewed. The patient's tolerance of                            previous anesthesia was also reviewed. The risks                            and benefits of the procedure and the sedation                            options and risks were discussed with the patient.                            All questions were answered, and informed consent                            was obtained. Prior Anticoagulants: The patient has                            taken no previous anticoagulant or antiplatelet                            agents. ASA Grade Assessment: II - A patient with                            mild systemic disease. After reviewing the risks                            and benefits, the patient was deemed in                            satisfactory condition to undergo the procedure.  After obtaining informed consent, the colonoscope                            was passed under direct vision. Throughout the                            procedure, the patient's blood pressure, pulse, and                            oxygen saturations were monitored continuously. The                            Colonoscope was introduced through the anus and   advanced to the the cecum, identified by                            appendiceal orifice and ileocecal valve. The                            colonoscopy was performed without difficulty. The                            patient tolerated the procedure well. The quality                            of the bowel preparation was good. The ileocecal                            valve, appendiceal orifice, and rectum were                            photographed. The bowel preparation used was                            Miralax via split dose instruction. Scope In: 8:41:56 AM Scope Out: 8:55:43 AM Scope Withdrawal Time: 0 hours 11 minutes 53 seconds  Total Procedure Duration: 0 hours 13 minutes 47 seconds  Findings:                 The perianal and digital rectal examinations were                            normal. Pertinent negatives include normal prostate                            (size, shape, and consistency).                           Two sessile polyps were found in the ascending                            colon. The polyps were diminutive in size. These                            polyps were removed with a  cold snare. Resection                            and retrieval were complete. Verification of                            patient identification for the specimen was done.                            Estimated blood loss was minimal.                           The exam was otherwise without abnormality on                            direct and retroflexion views. Complications:            No immediate complications. Estimated Blood Loss:     Estimated blood loss was minimal. Impression:               - Two diminutive polyps in the ascending colon,                            removed with a cold snare. Resected and retrieved.                           - The examination was otherwise normal on direct                            and retroflexion views.                           - Personal history  of colonic polyps. Two adenomas                            2007 (max 15 mm) Two 6 mm ssp 2014 Recommendation:           - Patient has a contact number available for                            emergencies. The signs and symptoms of potential                            delayed complications were discussed with the                            patient. Return to normal activities tomorrow.                            Written discharge instructions were provided to the                            patient.                           - Resume previous diet.                           -  Continue present medications.                           - Repeat colonoscopy is recommended for                            surveillance. The colonoscopy date will be                            determined after pathology results from today's                            exam become available for review. Gatha Mayer, MD 01/11/2020 9:06:27 AM This report has been signed electronically.

## 2020-01-13 ENCOUNTER — Telehealth: Payer: Self-pay | Admitting: *Deleted

## 2020-01-13 ENCOUNTER — Telehealth: Payer: Self-pay

## 2020-01-13 NOTE — Telephone Encounter (Signed)
Message left

## 2020-01-13 NOTE — Telephone Encounter (Signed)
  Follow up Call-  Call back number 01/11/2020  Post procedure Call Back phone  # [phone]  Permission to leave phone message Yes  Some recent data might be hidden     Left message

## 2020-01-20 DIAGNOSIS — M720 Palmar fascial fibromatosis [Dupuytren]: Secondary | ICD-10-CM | POA: Diagnosis not present

## 2020-01-21 ENCOUNTER — Encounter: Payer: Self-pay | Admitting: Internal Medicine

## 2020-01-21 DIAGNOSIS — Z86010 Personal history of colonic polyps: Secondary | ICD-10-CM

## 2020-05-06 ENCOUNTER — Ambulatory Visit
Admission: RE | Admit: 2020-05-06 | Discharge: 2020-05-06 | Disposition: A | Discharge status: Home / Self Care | Payer: Medicare Other | Source: Ambulatory Visit | Attending: Internal Medicine | Admitting: Internal Medicine

## 2020-05-06 DIAGNOSIS — I708 Atherosclerosis of other arteries: Secondary | ICD-10-CM | POA: Diagnosis not present

## 2020-05-06 DIAGNOSIS — J984 Other disorders of lung: Secondary | ICD-10-CM | POA: Diagnosis not present

## 2020-05-06 DIAGNOSIS — Z09 Encounter for follow-up examination after completed treatment for conditions other than malignant neoplasm: Secondary | ICD-10-CM

## 2020-05-06 DIAGNOSIS — I700 Atherosclerosis of aorta: Secondary | ICD-10-CM | POA: Diagnosis not present

## 2020-05-06 DIAGNOSIS — I2510 Atherosclerotic heart disease of native coronary artery without angina pectoris: Secondary | ICD-10-CM | POA: Diagnosis not present

## 2020-05-19 ENCOUNTER — Other Ambulatory Visit: Payer: Self-pay | Admitting: Internal Medicine

## 2020-05-19 DIAGNOSIS — R911 Solitary pulmonary nodule: Secondary | ICD-10-CM

## 2020-06-08 DIAGNOSIS — Z23 Encounter for immunization: Secondary | ICD-10-CM | POA: Diagnosis not present

## 2020-07-27 DIAGNOSIS — M720 Palmar fascial fibromatosis [Dupuytren]: Secondary | ICD-10-CM | POA: Diagnosis not present

## 2020-08-15 DIAGNOSIS — D3131 Benign neoplasm of right choroid: Secondary | ICD-10-CM | POA: Diagnosis not present

## 2020-08-15 DIAGNOSIS — Z961 Presence of intraocular lens: Secondary | ICD-10-CM | POA: Diagnosis not present

## 2020-09-30 DIAGNOSIS — M720 Palmar fascial fibromatosis [Dupuytren]: Secondary | ICD-10-CM | POA: Diagnosis not present

## 2020-10-05 DIAGNOSIS — M720 Palmar fascial fibromatosis [Dupuytren]: Secondary | ICD-10-CM | POA: Diagnosis not present

## 2020-10-13 DIAGNOSIS — M720 Palmar fascial fibromatosis [Dupuytren]: Secondary | ICD-10-CM | POA: Diagnosis not present

## 2020-10-27 DIAGNOSIS — Z125 Encounter for screening for malignant neoplasm of prostate: Secondary | ICD-10-CM | POA: Diagnosis not present

## 2020-10-27 DIAGNOSIS — I10 Essential (primary) hypertension: Secondary | ICD-10-CM | POA: Diagnosis not present

## 2020-11-01 DIAGNOSIS — R972 Elevated prostate specific antigen [PSA]: Secondary | ICD-10-CM | POA: Diagnosis not present

## 2020-11-01 DIAGNOSIS — R918 Other nonspecific abnormal finding of lung field: Secondary | ICD-10-CM | POA: Diagnosis not present

## 2020-11-01 DIAGNOSIS — N401 Enlarged prostate with lower urinary tract symptoms: Secondary | ICD-10-CM | POA: Diagnosis not present

## 2020-11-01 DIAGNOSIS — I10 Essential (primary) hypertension: Secondary | ICD-10-CM | POA: Diagnosis not present

## 2020-11-01 DIAGNOSIS — I2510 Atherosclerotic heart disease of native coronary artery without angina pectoris: Secondary | ICD-10-CM | POA: Diagnosis not present

## 2020-11-01 DIAGNOSIS — K8020 Calculus of gallbladder without cholecystitis without obstruction: Secondary | ICD-10-CM | POA: Diagnosis not present

## 2020-11-01 DIAGNOSIS — I119 Hypertensive heart disease without heart failure: Secondary | ICD-10-CM | POA: Diagnosis not present

## 2020-11-01 DIAGNOSIS — R351 Nocturia: Secondary | ICD-10-CM | POA: Diagnosis not present

## 2020-11-01 DIAGNOSIS — R7303 Prediabetes: Secondary | ICD-10-CM | POA: Diagnosis not present

## 2020-11-01 DIAGNOSIS — D485 Neoplasm of uncertain behavior of skin: Secondary | ICD-10-CM | POA: Diagnosis not present

## 2020-11-01 DIAGNOSIS — I700 Atherosclerosis of aorta: Secondary | ICD-10-CM | POA: Diagnosis not present

## 2020-11-01 DIAGNOSIS — Z0001 Encounter for general adult medical examination with abnormal findings: Secondary | ICD-10-CM | POA: Diagnosis not present

## 2020-11-03 DIAGNOSIS — R972 Elevated prostate specific antigen [PSA]: Secondary | ICD-10-CM | POA: Diagnosis not present

## 2020-11-03 DIAGNOSIS — N401 Enlarged prostate with lower urinary tract symptoms: Secondary | ICD-10-CM | POA: Diagnosis not present

## 2020-11-10 ENCOUNTER — Ambulatory Visit: Payer: Medicare Other | Admitting: Cardiology

## 2020-11-10 DIAGNOSIS — M720 Palmar fascial fibromatosis [Dupuytren]: Secondary | ICD-10-CM | POA: Diagnosis not present

## 2020-11-11 ENCOUNTER — Ambulatory Visit: Payer: Medicare Other | Admitting: Cardiology

## 2020-11-30 ENCOUNTER — Other Ambulatory Visit: Payer: Self-pay

## 2020-11-30 ENCOUNTER — Encounter: Payer: Self-pay | Admitting: Cardiology

## 2020-11-30 ENCOUNTER — Ambulatory Visit: Payer: Medicare Other | Admitting: Cardiology

## 2020-11-30 VITALS — BP 121/73 | HR 76 | Temp 98.2°F | Resp 16 | Ht 69.0 in | Wt 178.0 lb

## 2020-11-30 DIAGNOSIS — I2510 Atherosclerotic heart disease of native coronary artery without angina pectoris: Secondary | ICD-10-CM | POA: Diagnosis not present

## 2020-11-30 DIAGNOSIS — I10 Essential (primary) hypertension: Secondary | ICD-10-CM | POA: Diagnosis not present

## 2020-11-30 NOTE — Progress Notes (Signed)
Patient referred by Deland Pretty, MD for chest tightness  Subjective:   Jacob Garrett, male    DOB: 04-23-1951, 70 y.o.   MRN: [id number]   Chief Complaint  Patient presents with  . Coronary Artery Disease  . Hypertension  . Follow-up    1 year    HPI  70 year old Caucasian male with hypertension, nonobstructive CAD.  Patient has not had any episodes of presyncope. CTA in 01/2019 showed mild nonobstructive CAD. He is doing well, denies chest pain, shortness of breath, palpitations, leg edema, orthopnea, PND, TIA/syncope. Blood pressure is well controlled.  Reviewed recent benign results with the patient, he was below.  He remains reluctant to take any medication, but once a week or every 2 weeks.  He states that he is making changes to his diet, walks a couple of steps every day.   Current Outpatient Medications on File Prior to Visit  Medication Sig Dispense Refill  . amLODipine (NORVASC) 2.5 MG tablet TAKE 1 TABLET BY MOUTH EVERY DAY 90 tablet 1  . aspirin 81 MG chewable tablet Chew 81 mg by mouth daily.    . tamsulosin (FLOMAX) 0.4 MG CAPS capsule Take 0.4 mg by mouth daily.    . valsartan (DIOVAN) 320 MG tablet Take 320 mg by mouth daily.     No current facility-administered medications on file prior to visit.    Cardiovascular studies:  EKG 11/30/2020: Sinus  Rhythm  Normal EKG   Event monitor 02/23/2019 - 03/24/2019: Sinus rhythm. Rate 35-140 bpm. Sinus bradycardia at 35 bpm occurred on 7/22 at 11:35 PM, which may be normal. Two patient triggered events show sinus rhythm. No arrhythmia seen.  CTA 02/18/2019: Left Main:  No significant stenosis. FFR = 0.98 LAD: No significant stenosis. Proximal FFR = 0.94, Mid FFR =0.88, Distal FFR = 0.83, Diagonal branch proximal = 0.92, distal = 0.88 Ramus intermedius = proximal FFR = 0.97, distal = 0.95 LCX: No significant stenosis. Proximal FFR = 0.96, Distal FFR = 0.90 RCA: No significant stenosis. Proximal FFR = 0.96,  Mid FFR =0.89, Distal FFR = 0.86  IMPRESSION: 1.  CT FFR analysis didn't show any significant stenosis.  Echocardiogram 02/09/2019: Normal LV systolic function with EF 57%. Left ventricle cavity is normal in size. Mild concentric hypertrophy of the left ventricle. Normal global wall motion. Normal diastolic filling pattern. Calculated EF 57%. Left atrial cavity is normal in size. Aneurysmal interatrial septum without 2D or color Doppler evidence of interatrial shunt. Mild (Grade I) mitral regurgitation. Inadequate TR jet to estimate pulmonary artery systolic pressure. Normal right atrial pressure.    Recent labs: 10/27/2020: Glucose 133, BUN/Cr 17/0.8. EGFR 81. HbA1C N/A Chol 168, TG 201, HDL 56, LDL 116 TSH 2.4 normal  01/28/2019: Glucose 129. BUN/Cr 20/0.9. eGFR 84. Na/K 140/3.7. Total protein 8.7 (6.4-8.2) Rest of the CMP normal H/H 15/43. MCV 86. Platelets 312.  Chol 217, TG 155, HDL 58, LDL 79  Review of Systems  Cardiovascular: Negative for chest pain, dyspnea on exertion, leg swelling, palpitations and syncope.         Vitals:   11/30/20 1037  BP: 121/73  Pulse: 76  Resp: 16  Temp: 98.2 F (36.8 C)  SpO2: 99%     Objective:   Physical Exam Vitals and nursing note reviewed.  Constitutional:      General: He is not in acute distress. Neck:     Vascular: No JVD.  Cardiovascular:     Rate and Rhythm: Normal rate and  regular rhythm.     Pulses: Normal pulses.     Heart sounds: Normal heart sounds. No murmur heard.   Pulmonary:     Effort: Pulmonary effort is normal.     Breath sounds: Normal breath sounds. No wheezing or rales.  Musculoskeletal:     Right lower leg: No edema.     Left lower leg: No edema.            Assessment & Recommendations:   70 year old Caucasian male, former smoker, controlled hypertension, nonobstructive CAD.  Nonobstructive CAD: CTA 01/2019 Continue aspirin 81 mg. LDL 116, HDL 56.  He is reluctant to take statin or  any other medication at this time. Continue heart healthy diet and lifestyle.   Repeat lipid panel in 1 year.  Hypertension:  Controlled  F/u in 1 year . Nigel Mormon, MD [facility] [place] Pager: [phone] Office: [phone] If no answer Cell [phone]

## 2020-12-28 DIAGNOSIS — L821 Other seborrheic keratosis: Secondary | ICD-10-CM | POA: Diagnosis not present

## 2020-12-28 DIAGNOSIS — L570 Actinic keratosis: Secondary | ICD-10-CM | POA: Diagnosis not present

## 2021-03-03 ENCOUNTER — Other Ambulatory Visit: Payer: Self-pay

## 2021-03-03 ENCOUNTER — Ambulatory Visit (INDEPENDENT_AMBULATORY_CARE_PROVIDER_SITE_OTHER): Payer: Medicare Other | Admitting: Sports Medicine

## 2021-03-03 VITALS — BP 106/72 | Ht 69.0 in | Wt 175.0 lb

## 2021-03-03 DIAGNOSIS — G8929 Other chronic pain: Secondary | ICD-10-CM | POA: Diagnosis not present

## 2021-03-03 DIAGNOSIS — M6580 Other synovitis and tenosynovitis, unspecified site: Secondary | ICD-10-CM | POA: Diagnosis not present

## 2021-03-03 DIAGNOSIS — I2510 Atherosclerotic heart disease of native coronary artery without angina pectoris: Secondary | ICD-10-CM | POA: Diagnosis not present

## 2021-03-03 DIAGNOSIS — M25512 Pain in left shoulder: Secondary | ICD-10-CM | POA: Diagnosis not present

## 2021-03-03 NOTE — Patient Instructions (Addendum)
Complete range of motion and strengthening exercises May take Ibuprofen or Aleve as needed to help with pain/soreness with exercises Return in 1-2 weeks for full shoulder U/S Ice/heat as desires

## 2021-03-03 NOTE — Progress Notes (Signed)
PCP: Deland Pretty, MD  Subjective:   HPI: Patient is a 70 y.o. male here for evaluation of chronic left shoulder pain. 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 He also has had bilateral frozen shoulders in the past.   Patient was seen here at the clinic for his left shoulder a few years ago and had U/S and shoulder x-ray (reviewed by myself today) consistent with calcification within the supraspinatus tendon. He received a subacromial injection w/o much relief. PT has been helpful in the past.  Today, he states his shoulder pain has worsened over the past 1-2 months. Denies any inciting event or injury. He reports a deep "ache" within the shoulder. His pain is made worse with activity, specifically reaching over head or abduction of the shoulder. He does report somewhat limited ROM as well. He will occasionally ice and has taken Ibuprofen a few times, but not on a regular basis. He denies any fever/chills, swelling, or redness of the area. His pain is usually a 4/10, will get to a 6-7/10 with activity/shoulder motion.   Past Medical History:  Diagnosis Date   Anxiety    Calcific tendinitis of left shoulder    Cluster headache 1990   Colon polyps    Dupuytren's contracture of both hands    Elevated PSA    Hypertension    Hypertriglyceridemia    Inclusion cyst    Inguinal hernia    Pre-diabetes    Thoracic nerve root impingement    Thoracic spondylosis     Current Outpatient Medications on File Prior to Visit  Medication Sig Dispense Refill   amLODipine (NORVASC) 5 MG tablet Take 1 tablet by mouth daily.     aspirin 81 MG chewable tablet Chew 81 mg by mouth daily.     ibuprofen (ADVIL) 800 MG tablet as needed.     tamsulosin (FLOMAX) 0.4 MG CAPS capsule Take 0.4 mg by mouth daily.     valsartan (DIOVAN) 320 MG tablet Take 320 mg by mouth daily.     No current  facility-administered medications on file prior to visit.    Past Surgical History:  Procedure Laterality Date   benign nodule removed from foot tendon     CATARACT EXTRACTION, BILATERAL     COLONOSCOPY W/ POLYPECTOMY  last 06/11/2013   multiple   Dupuytrens surgeries     HAND SURGERY Right 01/2000   HERNIA REPAIR      Allergies  Allergen Reactions   Penicillins     REACTION: Childhood reaction unkown Unknown reaction as a child, told not to take it  REACTION: Childhood reaction unkown Unknown reaction as a child, told not to take it      Social History   Socioeconomic History   Marital status: Married    Spouse name: Not on file   Number of children: 3   Years of education: Not on file   Highest education level: Not on file  Occupational History   Occupation: retired  Tobacco Use   Smoking status: Former    Packs/day: 1.00    Years: 25.00    Pack years: 25.00    Types: Cigarettes    Quit date: 05/26/1999    Years since quitting: 21.7   Smokeless tobacco: Never  Vaping Use   Vaping Use: Never used  Substance and Sexual Activity   Alcohol use: Yes    Alcohol/week: 1.0 standard drink  Types: 1 Standard drinks or equivalent per week    Comment: occ   Drug use: Yes    Frequency: 7.0 times per week    Types: Marijuana   Sexual activity: Yes  Other Topics Concern   Not on file  Social History Narrative   Married - 3 daughters   Retired: Investment banker, 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[employer], [employer] but has worked in different regions of [place]   6 caffeine/day   Uses marijuana, no tobacco (former smoker), rare EtOH   Social Determinants of Radio broadcast assistant Strain: Not on file  Food Insecurity: Not on file  Transportation Needs: Not on file  Physical Activity: Not on file  Stress: Not on file  Social Connections: Not on file  Intimate Partner Violence: Not on file    Family History  Problem Relation Age of Onset   Hypertension Mother    Diabetes  Mother    Alcohol abuse Neg Hx    Arthritis Neg Hx    Cancer Neg Hx    Early death Neg Hx    Heart disease Neg Hx    Hyperlipidemia Neg Hx    Kidney disease Neg Hx    Stroke Neg Hx    Esophageal cancer Neg Hx    Colon cancer Neg Hx    Colon polyps Neg Hx    Stomach cancer Neg Hx    Rectal cancer Neg Hx     BP 106/72   Ht 5\' 9"  (1.753 m)   Wt 175 lb (79.4 kg)   BMI 25.84 kg/m   [facility] Adult Exercise 03/03/2021  Frequency of aerobic exercise (# of days/week) 6  Average time in minutes > 90  Frequency of strengthening activities (# of days/week) 0    No flowsheet data found.  Review of Systems: See HPI above.     Objective:  Physical Exam:  Gen: NAD, non-toxic; comfortable in exam room CV: RRR Resp: breathing unlabored on room air Psych: pleasant and appropriate in conversation, fluid thought content MSK: - Shoulder, Left: No evidence of bony deformity, asymmetry, or muscle atrophy. No joint effusion, warmth, or erythema noted. Mild TTP over coracoid process but No tenderness over long head of biceps (bicipital groove). No TTP at [facility] joint. + Restricted active ROM in forward flexion to approx 150 degrees, but able to be passively flexed to almost 180 degrees. + Restricted abduction to only 100 degrees, limited further secondary to significant pain. Internal and external rotation equivalent compared to contralateral side, no pain with these.Strength 5/5 throughout. Sensation intact. Peripheral pulses intact. Provocative testing: + Painful arc in forward flexion and abduction, + Drop arm test, + Empty can test, + pain with resisted abduction Negative: Hawking's, Neer, Gerber lift-off, Sulcus sign, IR/ER, Speed's, Yergason's, and O'briens testing - Hands: + Duputyren's contracture of 4th/5th digit of bilateral hands        Assessment & Plan:  1. Left shoulder pain, chronic - exam and provocative testing suggestive of supraspinatus pathology, likely progression  of calcific tendinopathy vs. partial tear. Strength is preserved, ROM is slightly reduced by similar to contralateral shoulder.  2. Hx of Calcific tendinopathy of supraspinatus, left  - Will provide patient with PT home exercises for ROM and strengthening for rotator cuff (Jobe's exercises, wall-walks, Pendulum swings, etc.)  - Will have patient return in 1-2 weeks for complete shoulder U/S to better evaluate integrity of RC, specifically supraspinatus - this will help guide further management - Ibuprofen/Aleve/Tylenol as  needed to help with pain/soreness with PT exercises - may use ice/heat as desires - f/u in 1-2 weeks for shoulder U/S   Elba Barman, DO; Sports Medicine Fellow 03/03/2021 [place]

## 2021-03-10 ENCOUNTER — Other Ambulatory Visit: Payer: Self-pay

## 2021-03-10 ENCOUNTER — Ambulatory Visit: Payer: Self-pay

## 2021-03-10 ENCOUNTER — Ambulatory Visit (INDEPENDENT_AMBULATORY_CARE_PROVIDER_SITE_OTHER): Payer: Medicare Other | Admitting: Sports Medicine

## 2021-03-10 VITALS — BP 116/80 | Ht 69.0 in | Wt 175.0 lb

## 2021-03-10 DIAGNOSIS — G8929 Other chronic pain: Secondary | ICD-10-CM | POA: Diagnosis not present

## 2021-03-10 DIAGNOSIS — M25512 Pain in left shoulder: Secondary | ICD-10-CM | POA: Diagnosis not present

## 2021-03-10 NOTE — Progress Notes (Addendum)
   Subjective:    Patient ID: Jacob Garrett, male    DOB: 1951-07-20, 70 y.o.   MRN: [id number]  HPI  Jacob Garrett presents today for a complete ultrasound evaluation of his left shoulder.  He has been doing his home exercises but still endorses a positive painful arc with forward flexion and abduction.  Difficulty sleeping at night as well.   Review of Systems     Objective:   Physical Exam  Well-developed, well-nourished.  Left shoulder: He has nearly full range of motion but a positive painful arc.  No significant atrophy.  Mildly positive empty can but his rotator cuff strength is 5/5 with resisted supraspinatus, infraspinatus, and subscapularis.  Neurovascularly intact distally.  Complete ultrasound of the left shoulder:  Biceps tendon was well visualized in long and short axis.  Short axis view shows a small amount of fluid surrounding the tendon.  Subscapularis was well visualized in long axis without abnormality.  Supraspinatus shows an area of calcification as well as humeral head irregularity in the midportion and posterior aspect of the tendon.  Anterior fibers appear unremarkable.  Mild amount of bursal swelling seen.  Acromioclavicular joint is unremarkable without evidence of spurring or effusion.  Infraspinatus was well visualized and unremarkable.  Glenohumeral joint appears unremarkable.  Findings are consistent with calcific supraspinatus tendinopathy and possible partial-thickness tearing.      Assessment & Plan:   Left shoulder pain secondary to supraspinatus tendinopathy/possible partial rotator cuff tear  I think Jacob Garrett would benefit from formal physical therapy.  We will set this up with Barbaraann Barthel.  I would like to see him back in the office in 4 weeks for reevaluation.  I discussed a subacromial cortisone injection but he had a rather significant steroid flare with his injection in 2018 and did not experience any symptom relief thereafter.  I also discussed topical  nitroglycerin but he has a history of cluster headaches.  If symptoms do not improve then we may need to consider merits of further diagnostic imaging.  Of note, we will start with an updated x-ray but may need to consider MRI as well.

## 2021-03-10 NOTE — Patient Instructions (Signed)
It was a pleasure to see you today!  For your shoulder pain: we will refer you for physical therapy. You should receive a phone call in 1-2 weeks to schedule this appointment. Follow up at this office 4 weeks after the start of physical therapy. Continue doing your exercises for now and using ice, tylenol/ibuprofen as needed.  Be Well,  Dr. Chauncey Reading

## 2021-03-21 DIAGNOSIS — M25512 Pain in left shoulder: Secondary | ICD-10-CM | POA: Diagnosis not present

## 2021-03-30 DIAGNOSIS — M25512 Pain in left shoulder: Secondary | ICD-10-CM | POA: Diagnosis not present

## 2021-04-11 DIAGNOSIS — M25512 Pain in left shoulder: Secondary | ICD-10-CM | POA: Diagnosis not present

## 2021-04-19 DIAGNOSIS — L821 Other seborrheic keratosis: Secondary | ICD-10-CM | POA: Diagnosis not present

## 2021-04-24 DIAGNOSIS — M25512 Pain in left shoulder: Secondary | ICD-10-CM | POA: Diagnosis not present

## 2021-04-25 ENCOUNTER — Ambulatory Visit (INDEPENDENT_AMBULATORY_CARE_PROVIDER_SITE_OTHER): Payer: Medicare Other | Admitting: Sports Medicine

## 2021-04-25 ENCOUNTER — Other Ambulatory Visit: Payer: Self-pay

## 2021-04-25 VITALS — Ht 70.0 in | Wt 180.0 lb

## 2021-04-25 DIAGNOSIS — G8929 Other chronic pain: Secondary | ICD-10-CM

## 2021-04-25 DIAGNOSIS — M25512 Pain in left shoulder: Secondary | ICD-10-CM

## 2021-04-25 DIAGNOSIS — M720 Palmar fascial fibromatosis [Dupuytren]: Secondary | ICD-10-CM

## 2021-04-25 DIAGNOSIS — I2510 Atherosclerotic heart disease of native coronary artery without angina pectoris: Secondary | ICD-10-CM

## 2021-04-25 NOTE — Patient Instructions (Signed)
We are glad to see that you are doing well today and progressing with physical therapy. Please relay this when you go to PT on Thursday. At this point you should continue your home PT exercises but do not need to continue going to physical therapy each week.    For your hands, Dr. Laurelyn Sickle with [facility] is who we would recommend. Please let us know if you need help with a referral in order to schedule an appointment.

## 2021-04-25 NOTE — Progress Notes (Signed)
   Subjective:    Patient ID: Jacob Garrett, male    DOB: [birth year], 70 y.o.   MRN: [id number]  Jacob Garrett is a 70 year-old male seen today in follow up.  Last seen 03/10/2021 for a 72-monthhistory of left shoulder pain.  A complete ultrasound evaluation of his left shoulder was performed at that time.  Supraspinatus tendinopathy suspected.  He was referred for formal PT at that time with JBarbaraann Bartheland has been going on a weekly basis while continuing to do his home PT exercises.  Today Mr. SGlessnerreports significant improvement in his left shoulder with regards to both pain and range of motion.  He does not currently feel limited in performing daily activities secondary to pain.  He is now able to sleep on his left shoulder at night without waking up because of pain.  Because of his significant improvement, he is wondering if he needs to continue to get formal PT or if continuing to do his home PT exercises twice daily is sufficient. Denies pain in his left elbow, wrist, or hand. Additionally denies numbness/tingling in his left arm.   ROS Negative unless otherwise noted above    Objective:   Exam: Left shoulder Inspection: No obvious deformity, swelling, erythema, or muscle atrophy.  Shoulders appear symmetric Palpation: No TTP of the left shoulder ROM: FROM with active forward flexion, extension, abduction.  He encounters some pain around 90-100 degrees of abduction but is able to continue without limitation 2/2 pain. Strength: 5/5 strength with resisted IR/ER and abduction Special tests: Positive empty can, negative Hawkins and Neer's, negative speeds, negative cross body adduction Neuro: NVI in L arm    Assessment & Plan:  Left shoulder pain secondary to supraspinatus tendinopathy  He has made significant progress with physical therapy.  He has full range of motion with minimal pain on exam today.  At this point he should continue home PT exercises but does not necessarily need to  continue going to formal physical therapy weekly.  His next PT session is Thursday (9/1) at which time he can discuss discharge from PT.  RTC if symptoms return.  Jacob Garrett Kitchen MD PGY-3  Patient seen and evaluated with the resident.  I agree with the above plan of care.  Patient has made excellent progress with physical therapy.  I think he can wean to a home exercise program at this point in time. Jacob Garrett has a history of Dupuytren's contracture in both hands.  His hand surgeon has recently retired and [place]  I have provided him with the name of Dr. GAmedeo Plentyand I would be happy to facilitate consultation if needed.  Otherwise, Jacob Garrett follow-up as needed.

## 2021-05-03 DIAGNOSIS — M25512 Pain in left shoulder: Secondary | ICD-10-CM | POA: Diagnosis not present

## 2021-05-15 DIAGNOSIS — Z23 Encounter for immunization: Secondary | ICD-10-CM | POA: Diagnosis not present

## 2021-06-12 DIAGNOSIS — M720 Palmar fascial fibromatosis [Dupuytren]: Secondary | ICD-10-CM | POA: Diagnosis not present

## 2021-08-18 DIAGNOSIS — D3131 Benign neoplasm of right choroid: Secondary | ICD-10-CM | POA: Diagnosis not present

## 2021-08-18 DIAGNOSIS — Z961 Presence of intraocular lens: Secondary | ICD-10-CM | POA: Diagnosis not present

## 2021-11-30 ENCOUNTER — Ambulatory Visit: Payer: Medicare Other | Admitting: Cardiology

## 2021-11-30 DIAGNOSIS — R972 Elevated prostate specific antigen [PSA]: Secondary | ICD-10-CM | POA: Diagnosis not present

## 2021-11-30 DIAGNOSIS — N401 Enlarged prostate with lower urinary tract symptoms: Secondary | ICD-10-CM | POA: Diagnosis not present

## 2021-12-06 ENCOUNTER — Ambulatory Visit: Payer: Medicare Other | Admitting: Cardiology

## 2021-12-06 ENCOUNTER — Encounter: Payer: Self-pay | Admitting: Cardiology

## 2021-12-06 VITALS — BP 135/82 | HR 71 | Temp 98.0°F | Resp 16 | Ht 70.0 in | Wt 191.0 lb

## 2021-12-06 DIAGNOSIS — I10 Essential (primary) hypertension: Secondary | ICD-10-CM | POA: Diagnosis not present

## 2021-12-06 DIAGNOSIS — I2510 Atherosclerotic heart disease of native coronary artery without angina pectoris: Secondary | ICD-10-CM

## 2021-12-06 NOTE — Progress Notes (Signed)
? ? ?Patient referred by Deland Pretty, MD for chest tightness ? ?Subjective:  ? ?Jacob Garrett, male    DOB: 1951/05/13, 71 y.o.   MRN: [id number] ? ? ?Chief Complaint  ?Patient presents with  ? Coronary Artery Disease  ? Follow-up  ?  1 year  ? ? ?HPI ? ?71 year old Caucasian male with hypertension, nonobstructive CAD. ? ?Patient walks 6,000-8,000 steps everyday. He has noticed his heart rate jumps faster and comes down slower with activity, compared to twenty years ago, However, he denies chest pain, shortness of breath, palpitations, leg edema, orthopnea, PND, TIA/syncope. He walks 1 mile in 12-14 min. Blood pressure only borderline elevated today. He has upcoming labs with Dr. Shelia Media next month.  ? ? ?Current Outpatient Medications:  ?  amLODipine (NORVASC) 5 MG tablet, Take 1 tablet by mouth daily., Disp: , Rfl:  ?  aspirin 81 MG chewable tablet, Chew 81 mg by mouth daily., Disp: , Rfl:  ?  ibuprofen (ADVIL) 800 MG tablet, as needed., Disp: , Rfl:  ?  tamsulosin (FLOMAX) 0.4 MG CAPS capsule, Take 0.4 mg by mouth daily., Disp: , Rfl:  ?  valsartan (DIOVAN) 320 MG tablet, Take 320 mg by mouth daily., Disp: , Rfl:  ? ? ? ?Cardiovascular studies: ? ?EKG 12/06/2021: ?Sinus rhythm 68 bpm ?Normal EKG ? ?Event monitor 02/23/2019 - 03/24/2019: ?Sinus rhythm. Rate 35-140 bpm. ?Sinus bradycardia at 35 bpm occurred on 7/22 at 11:35 PM, which may be normal. ?Two patient triggered events show sinus rhythm. ?No arrhythmia seen. ? ?CTA 02/18/2019: ?Left Main:  No significant stenosis. FFR = 0.98 ?LAD: No significant stenosis. Proximal FFR = 0.94, Mid FFR =0.88, ?Distal FFR = 0.83, Diagonal branch proximal = 0.92, distal = 0.88 ?Ramus intermedius = proximal FFR = 0.97, distal = 0.95 ?LCX: No significant stenosis. Proximal FFR = 0.96, Distal FFR = ?0.90 ?RCA: No significant stenosis. Proximal FFR = 0.96, Mid FFR =0.89, ?Distal FFR = 0.86 ?Calcium score: 110 ?  ?IMPRESSION: ?1.  CT FFR analysis didn't show any significant  stenosis. ? ?Echocardiogram 02/09/2019: ?Normal LV systolic function with EF 57%. Left ventricle cavity is normal in size. Mild concentric hypertrophy of the left ventricle. Normal global wall motion. Normal diastolic filling pattern. Calculated EF 57%. ?Left atrial cavity is normal in size. Aneurysmal interatrial septum without 2D or color Doppler evidence of interatrial shunt. ?Mild (Grade I) mitral regurgitation. ?Inadequate TR jet to estimate pulmonary artery systolic pressure. Normal right atrial pressure.  ? ? ?Recent labs: ?10/27/2020: ?Glucose 133, BUN/Cr 17/0.8. EGFR 81. ?HbA1C N/A ?Chol 168, TG 201, HDL 56, LDL 116 ?TSH 2.4 normal ? ?01/28/2019: ?Glucose 129. BUN/Cr 20/0.9. eGFR 84. Na/K 140/3.7. Total protein 8.7 (6.4-8.2) Rest of the CMP normal ?H/H 15/43. MCV 86. Platelets 312.  ?Chol 217, TG 155, HDL 58, LDL 79 ? ?Review of Systems  ?Cardiovascular:  Negative for chest pain, dyspnea on exertion, leg swelling, palpitations and syncope.  ? ?   ? ? ?Vitals:  ? 12/06/21 1315  ?BP: 135/82  ?Pulse: 71  ?Resp: 16  ?Temp: 98 ?F (36.7 ?C)  ?SpO2: 96%  ? ? ? ?Objective:  ? Physical Exam ?Vitals and nursing note reviewed.  ?Constitutional:   ?   General: He is not in acute distress. ?Neck:  ?   Vascular: No JVD.  ?Cardiovascular:  ?   Rate and Rhythm: Normal rate and regular rhythm.  ?   Heart sounds: Normal heart sounds. No murmur heard. ?Pulmonary:  ?   Effort: Pulmonary  effort is normal.  ?   Breath sounds: Normal breath sounds. No wheezing or rales.  ?  ? ? ? ? ?   ?Assessment & Recommendations:  ? ?71 year old Caucasian male, former smoker, controlled hypertension, nonobstructive CAD. ? ?Nonobstructive CAD: ?CTA 01/2019 ?Continue aspirin 81 mg. Can use eery other day.  ?LDL 116, HDL 56. (10/2020) ?He is reluctant to take statin or any other medication at this time. ?Continue heart healthy diet and lifestyle.   ?Upcoming lipid panel next month w/Dr. Shelia Media ? ?Hypertension: ?Controlled ? ?F/u in 1 year ?Marland Kitchen ?Nigel Mormon, MD ?[facility] ?Pager: [phone] ?Office: [phone] ?If no answer Cell [phone] ?   ?

## 2022-01-10 DIAGNOSIS — R7303 Prediabetes: Secondary | ICD-10-CM | POA: Diagnosis not present

## 2022-01-10 DIAGNOSIS — I10 Essential (primary) hypertension: Secondary | ICD-10-CM | POA: Diagnosis not present

## 2022-01-10 DIAGNOSIS — E781 Pure hyperglyceridemia: Secondary | ICD-10-CM | POA: Diagnosis not present

## 2022-01-10 DIAGNOSIS — Z125 Encounter for screening for malignant neoplasm of prostate: Secondary | ICD-10-CM | POA: Diagnosis not present

## 2022-01-19 DIAGNOSIS — R972 Elevated prostate specific antigen [PSA]: Secondary | ICD-10-CM | POA: Diagnosis not present

## 2022-01-19 DIAGNOSIS — R7303 Prediabetes: Secondary | ICD-10-CM | POA: Diagnosis not present

## 2022-01-19 DIAGNOSIS — Z0000 Encounter for general adult medical examination without abnormal findings: Secondary | ICD-10-CM | POA: Diagnosis not present

## 2022-01-19 DIAGNOSIS — E8809 Other disorders of plasma-protein metabolism, not elsewhere classified: Secondary | ICD-10-CM | POA: Diagnosis not present

## 2022-01-19 DIAGNOSIS — I119 Hypertensive heart disease without heart failure: Secondary | ICD-10-CM | POA: Diagnosis not present

## 2022-01-19 DIAGNOSIS — I10 Essential (primary) hypertension: Secondary | ICD-10-CM | POA: Diagnosis not present

## 2022-01-19 DIAGNOSIS — I700 Atherosclerosis of aorta: Secondary | ICD-10-CM | POA: Diagnosis not present

## 2022-07-05 DIAGNOSIS — M720 Palmar fascial fibromatosis [Dupuytren]: Secondary | ICD-10-CM | POA: Diagnosis not present

## 2022-07-05 DIAGNOSIS — M79641 Pain in right hand: Secondary | ICD-10-CM | POA: Diagnosis not present

## 2022-07-05 DIAGNOSIS — M79642 Pain in left hand: Secondary | ICD-10-CM | POA: Diagnosis not present

## 2022-08-29 DIAGNOSIS — H353121 Nonexudative age-related macular degeneration, left eye, early dry stage: Secondary | ICD-10-CM | POA: Diagnosis not present

## 2022-08-29 DIAGNOSIS — D3131 Benign neoplasm of right choroid: Secondary | ICD-10-CM | POA: Diagnosis not present

## 2022-08-29 DIAGNOSIS — Z961 Presence of intraocular lens: Secondary | ICD-10-CM | POA: Diagnosis not present

## 2022-09-04 ENCOUNTER — Other Ambulatory Visit: Payer: Self-pay | Admitting: Orthopedic Surgery

## 2022-09-04 DIAGNOSIS — L905 Scar conditions and fibrosis of skin: Secondary | ICD-10-CM | POA: Diagnosis not present

## 2022-09-04 DIAGNOSIS — G8918 Other acute postprocedural pain: Secondary | ICD-10-CM | POA: Diagnosis not present

## 2022-09-04 DIAGNOSIS — Z9889 Other specified postprocedural states: Secondary | ICD-10-CM | POA: Diagnosis not present

## 2022-09-04 DIAGNOSIS — M720 Palmar fascial fibromatosis [Dupuytren]: Secondary | ICD-10-CM | POA: Diagnosis not present

## 2022-09-13 DIAGNOSIS — M79642 Pain in left hand: Secondary | ICD-10-CM | POA: Diagnosis not present

## 2022-09-20 DIAGNOSIS — M79642 Pain in left hand: Secondary | ICD-10-CM | POA: Diagnosis not present

## 2022-09-27 DIAGNOSIS — M79642 Pain in left hand: Secondary | ICD-10-CM | POA: Diagnosis not present

## 2022-10-01 DIAGNOSIS — M79642 Pain in left hand: Secondary | ICD-10-CM | POA: Diagnosis not present

## 2022-10-10 DIAGNOSIS — M79642 Pain in left hand: Secondary | ICD-10-CM | POA: Diagnosis not present

## 2022-10-18 DIAGNOSIS — M79642 Pain in left hand: Secondary | ICD-10-CM | POA: Diagnosis not present

## 2022-12-04 NOTE — Progress Notes (Unsigned)
Patient referred by Merri Brunette, MD for chest tightness  Subjective:   Jacob Garrett, male    DOB: 08-15-1951, 72 y.o.   MRN: [id number]  *** No chief complaint on file.   HPI  72 year old Caucasian male with hypertension, nonobstructive CAD.  ****Patient walks 6,000-8,000 steps everyday. He has noticed his heart rate jumps faster and comes down slower with activity, compared to twenty years ago, However, he denies chest pain, shortness of breath, palpitations, leg edema, orthopnea, PND, TIA/syncope. He walks 1 mile in 12-14 min. Blood pressure only borderline elevated today. He has upcoming labs with Dr. Renne Crigler next month.    Current Outpatient Medications:    amLODipine (NORVASC) 5 MG tablet, Take 1 tablet by mouth daily., Disp: , Rfl:    aspirin 81 MG chewable tablet, Chew 81 mg by mouth daily., Disp: , Rfl:    ibuprofen (ADVIL) 800 MG tablet, as needed., Disp: , Rfl:    tamsulosin (FLOMAX) 0.4 MG CAPS capsule, Take 0.4 mg by mouth daily., Disp: , Rfl:    valsartan (DIOVAN) 320 MG tablet, Take 320 mg by mouth daily., Disp: , Rfl:     Cardiovascular studies:  EKG 12/06/2021: Sinus rhythm 68 bpm Normal EKG  Event monitor 02/23/2019 - 03/24/2019: Sinus rhythm. Rate 35-140 bpm. Sinus bradycardia at 35 bpm occurred on 7/22 at 11:35 PM, which may be normal. Two patient triggered events show sinus rhythm. No arrhythmia seen.  CTA 02/18/2019: Left Main:  No significant stenosis. FFR = 0.98 LAD: No significant stenosis. Proximal FFR = 0.94, Mid FFR =0.88, Distal FFR = 0.83, Diagonal branch proximal = 0.92, distal = 0.88 Ramus intermedius = proximal FFR = 0.97, distal = 0.95 LCX: No significant stenosis. Proximal FFR = 0.96, Distal FFR = 0.90 RCA: No significant stenosis. Proximal FFR = 0.96, Mid FFR =0.89, Distal FFR = 0.86 Calcium score: 110   IMPRESSION: 1.  CT FFR analysis didn't show any significant stenosis.  Echocardiogram 02/09/2019: Normal LV systolic function  with EF 57%. Left ventricle cavity is normal in size. Mild concentric hypertrophy of the left ventricle. Normal global wall motion. Normal diastolic filling pattern. Calculated EF 57%. Left atrial cavity is normal in size. Aneurysmal interatrial septum without 2D or color Doppler evidence of interatrial shunt. Mild (Grade I) mitral regurgitation. Inadequate TR jet to estimate pulmonary artery systolic pressure. Normal right atrial pressure.    Recent labs: 01/11/2022: Glucose 125, BUN/Cr 15/0.96. EGFR 79. Na/K 138/4.3. Rest of the CMP normal H/H 16/46. MCV 86. Platelets 279 HbA1C 6.1% Chol 199, TG 182, HDL 52, LDL 111  01/28/2019: Glucose 129. BUN/Cr 20/0.9. eGFR 84. Na/K 140/3.7. Total protein 8.7 (6.4-8.2) Rest of the CMP normal H/H 15/43. MCV 86. Platelets 312.  Chol 217, TG 155, HDL 58, LDL 79  Review of Systems  Cardiovascular:  Negative for chest pain, dyspnea on exertion, leg swelling, palpitations and syncope.         There were no vitals filed for this visit.    Objective:   Physical Exam Vitals and nursing note reviewed.  Constitutional:      General: He is not in acute distress. Neck:     Vascular: No JVD.  Cardiovascular:     Rate and Rhythm: Normal rate and regular rhythm.     Heart sounds: Normal heart sounds. No murmur heard. Pulmonary:     Effort: Pulmonary effort is normal.     Breath sounds: Normal breath sounds. No wheezing or rales.  Assessment & Recommendations:   72 y/o Caucasian male, former smoker, controlled hypertension, nonobstructive CAD.  Nonobstructive CAD: CTA 01/2019 Continue aspirin 81 mg. Can use every other day.  Chol 199, TG 182, HDL 52, LDL 111 (12/2021) He is reluctant to take statin or any other medication at this time. Continue heart healthy diet and lifestyle.   Upcoming lipid panel next month w/Dr. Renne Crigler  Hypertension: Controlled  F/u in 1 year . Elder Negus, MD [facility].  [place] Pager: [phone] Office: [phone] If no answer Cell [phone]

## 2022-12-05 ENCOUNTER — Encounter: Payer: Self-pay | Admitting: Cardiology

## 2022-12-05 ENCOUNTER — Ambulatory Visit: Payer: Medicare Other | Admitting: Cardiology

## 2022-12-05 VITALS — BP 132/77 | HR 74 | Resp 16 | Ht 70.0 in | Wt 192.0 lb

## 2022-12-05 DIAGNOSIS — I2510 Atherosclerotic heart disease of native coronary artery without angina pectoris: Secondary | ICD-10-CM | POA: Diagnosis not present

## 2022-12-05 DIAGNOSIS — I10 Essential (primary) hypertension: Secondary | ICD-10-CM

## 2022-12-06 DIAGNOSIS — N401 Enlarged prostate with lower urinary tract symptoms: Secondary | ICD-10-CM | POA: Diagnosis not present

## 2022-12-06 DIAGNOSIS — R972 Elevated prostate specific antigen [PSA]: Secondary | ICD-10-CM | POA: Diagnosis not present

## 2023-02-14 DIAGNOSIS — Z125 Encounter for screening for malignant neoplasm of prostate: Secondary | ICD-10-CM | POA: Diagnosis not present

## 2023-02-14 DIAGNOSIS — I10 Essential (primary) hypertension: Secondary | ICD-10-CM | POA: Diagnosis not present

## 2023-02-14 DIAGNOSIS — R7303 Prediabetes: Secondary | ICD-10-CM | POA: Diagnosis not present

## 2023-02-21 ENCOUNTER — Other Ambulatory Visit: Payer: Self-pay | Admitting: Internal Medicine

## 2023-02-21 DIAGNOSIS — R7303 Prediabetes: Secondary | ICD-10-CM | POA: Diagnosis not present

## 2023-02-21 DIAGNOSIS — I119 Hypertensive heart disease without heart failure: Secondary | ICD-10-CM | POA: Diagnosis not present

## 2023-02-21 DIAGNOSIS — R918 Other nonspecific abnormal finding of lung field: Secondary | ICD-10-CM | POA: Diagnosis not present

## 2023-02-21 DIAGNOSIS — G250 Essential tremor: Secondary | ICD-10-CM | POA: Diagnosis not present

## 2023-02-21 DIAGNOSIS — M6250 Muscle wasting and atrophy, not elsewhere classified, unspecified site: Secondary | ICD-10-CM | POA: Diagnosis not present

## 2023-02-21 DIAGNOSIS — F320 Major depressive disorder, single episode, mild: Secondary | ICD-10-CM | POA: Diagnosis not present

## 2023-02-21 DIAGNOSIS — Z0000 Encounter for general adult medical examination without abnormal findings: Secondary | ICD-10-CM | POA: Diagnosis not present

## 2023-02-21 DIAGNOSIS — I2510 Atherosclerotic heart disease of native coronary artery without angina pectoris: Secondary | ICD-10-CM | POA: Diagnosis not present

## 2023-02-21 DIAGNOSIS — E781 Pure hyperglyceridemia: Secondary | ICD-10-CM | POA: Diagnosis not present

## 2023-02-21 DIAGNOSIS — I700 Atherosclerosis of aorta: Secondary | ICD-10-CM | POA: Diagnosis not present

## 2023-02-21 DIAGNOSIS — I10 Essential (primary) hypertension: Secondary | ICD-10-CM | POA: Diagnosis not present

## 2023-02-21 DIAGNOSIS — R5383 Other fatigue: Secondary | ICD-10-CM | POA: Diagnosis not present

## 2023-02-27 DIAGNOSIS — H353131 Nonexudative age-related macular degeneration, bilateral, early dry stage: Secondary | ICD-10-CM | POA: Diagnosis not present

## 2023-03-14 ENCOUNTER — Ambulatory Visit (INDEPENDENT_AMBULATORY_CARE_PROVIDER_SITE_OTHER): Payer: Medicare Other | Admitting: Podiatry

## 2023-03-14 DIAGNOSIS — M722 Plantar fascial fibromatosis: Secondary | ICD-10-CM | POA: Diagnosis not present

## 2023-03-14 NOTE — Progress Notes (Signed)
Subjective:  Patient ID: Jacob Garrett, male    DOB: [birth year],  MRN: [id number] HPI Chief Complaint  Patient presents with   Foot Pain    Pt came in for the pain that he is having on the bottom of his right foot that started several months ago. He also stated that he has a ingrown toenail on his index toe on the left which is painful he said.   Plantar Fasciitis    72 y.o. male presents with the above complaint.   ROS: Denies fever chills nausea vomiting muscle aches pains calf pain back pain chest pain shortness of breath.  Past Medical History:  Diagnosis Date   Anxiety    Calcific tendinitis of left shoulder    Cluster headache 1990   Colon polyps    Dupuytren's contracture of both hands    Elevated PSA    Hypertension    Hypertriglyceridemia    Inclusion cyst    Inguinal hernia    Pre-diabetes    Thoracic nerve root impingement    Thoracic spondylosis    Past Surgical History:  Procedure Laterality Date   benign nodule removed from foot tendon     CATARACT EXTRACTION, BILATERAL     COLONOSCOPY W/ POLYPECTOMY  last 06/11/2013   multiple   Dupuytrens surgeries     HAND SURGERY Right 01/2000   HERNIA REPAIR      Current Outpatient Medications:    NONFORMULARY OR COMPOUNDED ITEM, [facility] Compound Scar cream #14 60 grams 5 refills Faxed on 03/14/23, Disp: , Rfl:    amLODipine (NORVASC) 5 MG tablet, Take 1 tablet by mouth daily., Disp: , Rfl:    ibuprofen (ADVIL) 800 MG tablet, as needed., Disp: , Rfl:    tamsulosin (FLOMAX) 0.4 MG CAPS capsule, Take 0.4 mg by mouth daily., Disp: , Rfl:    valsartan (DIOVAN) 320 MG tablet, Take 320 mg by mouth daily., Disp: , Rfl:   Allergies  Allergen Reactions   Penicillins     REACTION: Childhood reaction unkown Unknown reaction as a child, told not to take it  REACTION: Childhood reaction unkown Unknown reaction as a child, told not to take it     Review of Systems Objective:  There were no vitals filed for  this visit.  General: Well developed, nourished, in no acute distress, alert and oriented x3   Dermatological: Skin is warm, dry and supple bilateral. Nails x 10 are well maintained; remaining integument appears unremarkable at this time. There are no open sores, no preulcerative lesions, no rash or signs of infection present.  Vascular: Dorsalis Pedis artery and Posterior Tibial artery pedal pulses are 2/4 bilateral with immedate capillary fill time. Pedal hair growth present. No varicosities and no lower extremity edema present bilateral.   Neruologic: Grossly intact via light touch bilateral. Vibratory intact via tuning fork bilateral. Protective threshold with Semmes Wienstein monofilament intact to all pedal sites bilateral. Patellar and Achilles deep tendon reflexes 2+ bilateral. No Babinski or clonus noted bilateral.   Musculoskeletal: No gross boney pedal deformities bilateral. No pain, crepitus, or limitation noted with foot and ankle range of motion bilateral. Muscular strength 5/5 in all groups tested bilateral.  Plantar fibromatosis with 2 nodules beneath the second and third metatarsal heads much decreased from previous evaluation.  Pretty much nontender at this point.  Gait: Unassisted, Nonantalgic.    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  Plan: Discussed etiology pathology and surgical therapies at this point we discussed his ingrown toenail second digit.  Discussed appropriate shoes.  We also discussed writing him another prescription for verapamil cream.     Jacob Garrett T. [place], [place]

## 2023-03-15 DIAGNOSIS — M6281 Muscle weakness (generalized): Secondary | ICD-10-CM | POA: Diagnosis not present

## 2023-03-15 DIAGNOSIS — M6250 Muscle wasting and atrophy, not elsewhere classified, unspecified site: Secondary | ICD-10-CM | POA: Diagnosis not present

## 2023-03-19 ENCOUNTER — Ambulatory Visit
Admission: RE | Admit: 2023-03-19 | Discharge: 2023-03-19 | Disposition: A | Discharge status: Home / Self Care | Payer: Medicare Other | Source: Ambulatory Visit | Attending: Internal Medicine | Admitting: Internal Medicine

## 2023-03-19 DIAGNOSIS — R911 Solitary pulmonary nodule: Secondary | ICD-10-CM | POA: Diagnosis not present

## 2023-03-19 DIAGNOSIS — Z09 Encounter for follow-up examination after completed treatment for conditions other than malignant neoplasm: Secondary | ICD-10-CM | POA: Diagnosis not present

## 2023-03-19 DIAGNOSIS — R918 Other nonspecific abnormal finding of lung field: Secondary | ICD-10-CM

## 2023-03-28 DIAGNOSIS — R7303 Prediabetes: Secondary | ICD-10-CM | POA: Diagnosis not present

## 2023-03-28 DIAGNOSIS — R778 Other specified abnormalities of plasma proteins: Secondary | ICD-10-CM | POA: Diagnosis not present

## 2023-03-28 DIAGNOSIS — E7800 Pure hypercholesterolemia, unspecified: Secondary | ICD-10-CM | POA: Diagnosis not present

## 2023-04-23 ENCOUNTER — Ambulatory Visit (INDEPENDENT_AMBULATORY_CARE_PROVIDER_SITE_OTHER): Payer: Medicare Other | Admitting: Podiatry

## 2023-04-23 ENCOUNTER — Encounter: Payer: Self-pay | Admitting: Podiatry

## 2023-04-23 DIAGNOSIS — M722 Plantar fascial fibromatosis: Secondary | ICD-10-CM | POA: Diagnosis not present

## 2023-04-23 NOTE — Progress Notes (Signed)
He presents today for follow-up of his plantar fibroma on his right foot.  States that well now about 1 of these bumps on my other foot as well.  He continues to use the verapamil.  Objective: Vital signs are stable alert oriented x 3 there is no erythema cellulitis drainage or he does have a palpable firm nonpulsatile nodule medial band of the plantar fascia measuring less than 1.0 cm in diameter left foot.  He also has a fibrous mass bilateral plantar hallux along the course of the FHL very consistent with Dupuytren's contractures of the hands.  Assessment plantar fibromas bilateral.  Plan: Encouraged him to continue the use of the verapamil cream and I would be leery of injecting cortisone directly into the long flexor tendon.

## 2023-05-14 DIAGNOSIS — Z23 Encounter for immunization: Secondary | ICD-10-CM | POA: Diagnosis not present

## 2023-06-11 ENCOUNTER — Ambulatory Visit (INDEPENDENT_AMBULATORY_CARE_PROVIDER_SITE_OTHER): Payer: Medicare Other | Admitting: Podiatry

## 2023-06-11 ENCOUNTER — Encounter: Payer: Self-pay | Admitting: Podiatry

## 2023-06-11 DIAGNOSIS — M722 Plantar fascial fibromatosis: Secondary | ICD-10-CM | POA: Diagnosis not present

## 2023-06-11 NOTE — Progress Notes (Signed)
He presents today for follow-up of his plantar fibromatosis states that he is going continue to use the verapamil cream for the past 3 months but it really has not done anything for him so far feels like he is getting a drawing sensation from his left hallux plantarly down the midfoot.  Objective: Vital signs are stable alert oriented x 3.  There is no erythema edema cellulitis drainage or odor from nodules which are unchanged at this point not only beneath the hallux bilaterally but in the plantar fascia particular on the left foot.  Assessment: Plantar fibromatosis.  Plan: Discussed etiology pathology conservative therapies today we discussed different types of therapy including injection therapy radiation therapy Xiaflex treatments or and/or the continuation of the verapamil cream.  I will follow-up with him in a few months

## 2023-06-20 DIAGNOSIS — Z4789 Encounter for other orthopedic aftercare: Secondary | ICD-10-CM | POA: Diagnosis not present

## 2023-06-20 DIAGNOSIS — M720 Palmar fascial fibromatosis [Dupuytren]: Secondary | ICD-10-CM | POA: Diagnosis not present

## 2023-08-16 DIAGNOSIS — H353131 Nonexudative age-related macular degeneration, bilateral, early dry stage: Secondary | ICD-10-CM | POA: Diagnosis not present

## 2023-09-11 DIAGNOSIS — M720 Palmar fascial fibromatosis [Dupuytren]: Secondary | ICD-10-CM | POA: Diagnosis not present

## 2023-09-24 ENCOUNTER — Ambulatory Visit (INDEPENDENT_AMBULATORY_CARE_PROVIDER_SITE_OTHER): Payer: Medicare Other | Admitting: Podiatry

## 2023-09-24 ENCOUNTER — Encounter: Payer: Self-pay | Admitting: Podiatry

## 2023-09-24 DIAGNOSIS — M722 Plantar fascial fibromatosis: Secondary | ICD-10-CM | POA: Diagnosis not present

## 2023-09-24 MED ORDER — TRIAMCINOLONE ACETONIDE 40 MG/ML IJ SUSP
40.0000 mg | Freq: Once | INTRAMUSCULAR | Status: AC
Start: 2023-09-24 — End: 2023-09-24
  Administered 2023-09-24: 40 mg

## 2023-09-25 NOTE — Progress Notes (Signed)
He presents today for follow-up of his plantar fibromatosis he has been putting his medication on these fibromas and states that really have not noticed much difference in his experience a lot of pulling on the bottom of the foot and in the arch.  He states that the nodules seem to be worse right here as he points to the base of the proximal phalanx of the hallux right and to some degree he says on the side overhears he points to the left.  Objective: Vital signs are stable he is alert oriented x 3.  Pulses are palpable.  There is no erythema edema cellulitis drainage or odor firm nonpulsatile nodules with a history of plantar fibromatosis to the plantar aspect proximal portion of the proximal phalanx hallux right the nodule measures a little greater than a centimeter in diameter as does the 1 on the left though not as firm.  Assessment: Plantar fibromatosis.  Plan: Discussed etiology pathology conservative surgical therapies at this point we are going to take the risk and inject these with Kenalog I injected them fairly superficial as did not involve the flexor tendon.  Hopefully this will decrease the thickness and the pain.  He only received 5 mg to each plantar fibroma and again it was Superficial not going deep to the long flexor.

## 2023-09-30 DIAGNOSIS — I10 Essential (primary) hypertension: Secondary | ICD-10-CM | POA: Diagnosis not present

## 2023-09-30 DIAGNOSIS — R778 Other specified abnormalities of plasma proteins: Secondary | ICD-10-CM | POA: Diagnosis not present

## 2023-09-30 DIAGNOSIS — R7303 Prediabetes: Secondary | ICD-10-CM | POA: Diagnosis not present

## 2023-10-01 DIAGNOSIS — I700 Atherosclerosis of aorta: Secondary | ICD-10-CM | POA: Diagnosis not present

## 2023-10-01 DIAGNOSIS — R918 Other nonspecific abnormal finding of lung field: Secondary | ICD-10-CM | POA: Diagnosis not present

## 2023-10-01 DIAGNOSIS — E119 Type 2 diabetes mellitus without complications: Secondary | ICD-10-CM | POA: Diagnosis not present

## 2023-10-01 DIAGNOSIS — I10 Essential (primary) hypertension: Secondary | ICD-10-CM | POA: Diagnosis not present

## 2023-10-10 DIAGNOSIS — M65331 Trigger finger, right middle finger: Secondary | ICD-10-CM | POA: Diagnosis not present

## 2023-10-10 DIAGNOSIS — M65341 Trigger finger, right ring finger: Secondary | ICD-10-CM | POA: Diagnosis not present

## 2023-10-10 DIAGNOSIS — M720 Palmar fascial fibromatosis [Dupuytren]: Secondary | ICD-10-CM | POA: Diagnosis not present

## 2023-12-05 ENCOUNTER — Ambulatory Visit: Payer: Self-pay | Admitting: Cardiology

## 2023-12-05 DIAGNOSIS — R972 Elevated prostate specific antigen [PSA]: Secondary | ICD-10-CM | POA: Diagnosis not present

## 2023-12-05 DIAGNOSIS — N401 Enlarged prostate with lower urinary tract symptoms: Secondary | ICD-10-CM | POA: Diagnosis not present

## 2023-12-09 DIAGNOSIS — M720 Palmar fascial fibromatosis [Dupuytren]: Secondary | ICD-10-CM | POA: Diagnosis not present

## 2023-12-09 DIAGNOSIS — M721 Knuckle pads: Secondary | ICD-10-CM | POA: Diagnosis not present

## 2024-02-14 DIAGNOSIS — Z125 Encounter for screening for malignant neoplasm of prostate: Secondary | ICD-10-CM | POA: Diagnosis not present

## 2024-02-14 DIAGNOSIS — I10 Essential (primary) hypertension: Secondary | ICD-10-CM | POA: Diagnosis not present

## 2024-02-14 DIAGNOSIS — R7303 Prediabetes: Secondary | ICD-10-CM | POA: Diagnosis not present

## 2024-02-14 DIAGNOSIS — Z961 Presence of intraocular lens: Secondary | ICD-10-CM | POA: Diagnosis not present

## 2024-02-14 DIAGNOSIS — H353131 Nonexudative age-related macular degeneration, bilateral, early dry stage: Secondary | ICD-10-CM | POA: Diagnosis not present

## 2024-02-18 DIAGNOSIS — M721 Knuckle pads: Secondary | ICD-10-CM | POA: Diagnosis not present

## 2024-02-18 DIAGNOSIS — M720 Palmar fascial fibromatosis [Dupuytren]: Secondary | ICD-10-CM | POA: Diagnosis not present

## 2024-02-24 DIAGNOSIS — N401 Enlarged prostate with lower urinary tract symptoms: Secondary | ICD-10-CM | POA: Diagnosis not present

## 2024-02-24 DIAGNOSIS — R972 Elevated prostate specific antigen [PSA]: Secondary | ICD-10-CM | POA: Diagnosis not present

## 2024-02-24 DIAGNOSIS — I700 Atherosclerosis of aorta: Secondary | ICD-10-CM | POA: Diagnosis not present

## 2024-02-24 DIAGNOSIS — Z0000 Encounter for general adult medical examination without abnormal findings: Secondary | ICD-10-CM | POA: Diagnosis not present

## 2024-02-24 DIAGNOSIS — I10 Essential (primary) hypertension: Secondary | ICD-10-CM | POA: Diagnosis not present

## 2024-02-24 DIAGNOSIS — E119 Type 2 diabetes mellitus without complications: Secondary | ICD-10-CM | POA: Diagnosis not present

## 2024-02-24 DIAGNOSIS — M722 Plantar fascial fibromatosis: Secondary | ICD-10-CM | POA: Diagnosis not present

## 2024-02-24 DIAGNOSIS — T63441A Toxic effect of venom of bees, accidental (unintentional), initial encounter: Secondary | ICD-10-CM | POA: Diagnosis not present

## 2024-07-14 DIAGNOSIS — L720 Epidermal cyst: Secondary | ICD-10-CM | POA: Diagnosis not present

## 2024-07-28 ENCOUNTER — Encounter: Payer: Self-pay | Admitting: Cardiology

## 2024-07-28 ENCOUNTER — Ambulatory Visit: Attending: Cardiology | Admitting: Cardiology

## 2024-07-28 VITALS — BP 136/77 | HR 85 | Ht 69.0 in | Wt 189.0 lb

## 2024-07-28 DIAGNOSIS — E782 Mixed hyperlipidemia: Secondary | ICD-10-CM | POA: Insufficient documentation

## 2024-07-28 DIAGNOSIS — I10 Essential (primary) hypertension: Secondary | ICD-10-CM | POA: Insufficient documentation

## 2024-07-28 DIAGNOSIS — I2510 Atherosclerotic heart disease of native coronary artery without angina pectoris: Secondary | ICD-10-CM | POA: Insufficient documentation

## 2024-07-28 NOTE — Patient Instructions (Signed)
 Medication Instructions:  Your physician recommends that you continue on your current medications as directed. Please refer to the Current Medication list given to you today.  *If you need a refill on your cardiac medications before your next appointment, please call your pharmacy*  Lab Work: NONE ordered at this time of appointment   Testing/Procedures: NONE ordered at this time of appointment   Follow-Up: At [facility], you and your health needs are our priority.  As part of our continuing mission to provide you with exceptional heart care, our providers are all part of one team.  This team includes your primary Cardiologist (physician) and Advanced Practice Providers or APPs (Physician Assistants and Nurse Practitioners) who all work together to provide you with the care you need, when you need it.  Your next appointment:   2 year(s)  Provider:   Dr. Elmira  We recommend signing up for the patient portal called MyChart.  Sign up information is provided on this After Visit Summary.  MyChart is used to connect with patients for Virtual Visits (Telemedicine).  Patients are able to view lab/test results, encounter notes, upcoming appointments, etc.  Non-urgent messages can be sent to your provider as well.   To learn more about what you can do with MyChart, go to [email or web address].

## 2024-07-28 NOTE — Progress Notes (Signed)
  Cardiology Office Note:  .   Date:  07/28/2024  ID:  Jacob Garrett, DOB 1951-07-18, MRN [id number] PCP: Clarice Nottingham, MD  [facility] Providers Cardiologist:  Jacob Lawrence, MD PCP: Clarice Nottingham, MD  Chief Complaint  Patient presents with   Hyperlipidemia     Jacob Garrett is a 73 y.o. male with hypertension, hyperlipidemia, prediabetes, nonobstructive CAD  History of Present Illness  Patient denies any cardiac complaints.  He exercises regularly without any chest pain, shortness of breath.  Reviewed recent lab results with patient, details below.     Vitals:   07/28/24 0955  BP: 136/77  Pulse: 85  SpO2: 98%      Review of Systems  Cardiovascular:  Negative for chest pain, dyspnea on exertion, leg swelling, palpitations and syncope.        Studies Reviewed: SABRA       EKG 07/2024: Normal sinus rhythm Nonspecific ST abnormality No previous ECGs available  Cor CTA 2020: Left Main:  No significant stenosis. FFR = 0.98 LAD: No significant stenosis. Proximal FFR = 0.94, Mid FFR =0.88, Distal FFR = 0.83, Diagonal branch proximal = 0.92, distal = 0.88 Ramus intermedius = proximal FFR = 0.97, distal = 0.95 LCX: No significant stenosis. Proximal FFR = 0.96, Distal FFR = 0.90 RCA: No significant stenosis. Proximal FFR = 0.96, Mid FFR =0.89, Distal FFR = 0.86 Calcium  score: 110  Echocardiogram 2020: Normal LV systolic function with EF 57%. Left ventricle cavity is normal in size. Mild concentric hypertrophy of the left ventricle. Normal global wall motion. Normal diastolic filling pattern. Calculated EF 57%. Left atrial cavity is normal in size. Aneurysmal interatrial septum without 2D or color Doppler evidence of interatrial shunt. Mild (Grade I) mitral regurgitation. Inadequate TR jet to estimate pulmonary artery systolic pressure. Normal right atrial pressure.     Labs 01/2024: Chol 183, TG 213, HDL 50, LDL 97 HbA1C 5.9% Hb 16.4 Cr 0.9 TSH  2.3    Physical Exam Vitals and nursing note reviewed.  Constitutional:      General: He is not in acute distress. Neck:     Vascular: No JVD.  Cardiovascular:     Rate and Rhythm: Normal rate and regular rhythm.     Heart sounds: Normal heart sounds. No murmur heard. Pulmonary:     Effort: Pulmonary effort is normal.     Breath sounds: Normal breath sounds. No wheezing or rales.  Musculoskeletal:     Right lower leg: No edema.     Left lower leg: No edema.      VISIT DIAGNOSES:   ICD-10-CM   1. Coronary artery disease involving native coronary artery of native heart without angina pectoris  I25.10 EKG 12-Lead    2. Essential hypertension  I10 EKG 12-Lead    3. Mixed hyperlipidemia  E78.2        Jacob Garrett is a 73 y.o. male with hypertension, hyperlipidemia, prediabetes, nonobstructive CAD Assessment & Plan  Nonobstructive CAD: CTA 01/2019. Given <50% stenosis, Aspirin not indicated.  Chol 199, TG 182, HDL 52, LDL 111 (12/2021) He continues to be reluctant to take statin or any other medication at this time. Continue heart healthy diet and lifestyle.     Hypertension: Controlled  Prediabetes: Discussed diet and lifestyle modification.  He wants to hold off medications at this time.   F/u in 2 years  Signed, Jacob JINNY Lawrence, MD
